# Patient Record
Sex: Female | Born: 1955 | Race: Black or African American | Hispanic: No | Marital: Single | Smoking: Never smoker
Health system: Southern US, Community
[De-identification: ages and names within clinical notes are randomized; demographics above are authoritative.]

## PROBLEM LIST (undated history)

## (undated) DIAGNOSIS — K219 Gastro-esophageal reflux disease without esophagitis: Secondary | ICD-10-CM

## (undated) DIAGNOSIS — E785 Hyperlipidemia, unspecified: Secondary | ICD-10-CM

## (undated) DIAGNOSIS — Z9889 Other specified postprocedural states: Secondary | ICD-10-CM

## (undated) DIAGNOSIS — E119 Type 2 diabetes mellitus without complications: Secondary | ICD-10-CM

## (undated) DIAGNOSIS — F329 Major depressive disorder, single episode, unspecified: Secondary | ICD-10-CM

## (undated) DIAGNOSIS — I10 Essential (primary) hypertension: Secondary | ICD-10-CM

## (undated) DIAGNOSIS — R112 Nausea with vomiting, unspecified: Secondary | ICD-10-CM

## (undated) DIAGNOSIS — F32A Depression, unspecified: Secondary | ICD-10-CM

## (undated) DIAGNOSIS — M1990 Unspecified osteoarthritis, unspecified site: Secondary | ICD-10-CM

## (undated) HISTORY — PX: TUBAL LIGATION: SHX77

## (undated) HISTORY — DX: Gastro-esophageal reflux disease without esophagitis: K21.9

## (undated) HISTORY — PX: CARPAL TUNNEL RELEASE: SHX101

## (undated) HISTORY — PX: DILATION AND CURETTAGE OF UTERUS: SHX78

## (undated) HISTORY — DX: Hyperlipidemia, unspecified: E78.5

---

## 1998-08-30 ENCOUNTER — Ambulatory Visit (HOSPITAL_COMMUNITY): Admission: RE | Admit: 1998-08-30 | Discharge: 1998-08-30 | Payer: Self-pay | Admitting: Obstetrics and Gynecology

## 1998-08-30 ENCOUNTER — Encounter: Payer: Self-pay | Admitting: Obstetrics and Gynecology

## 2001-06-07 ENCOUNTER — Encounter: Admission: RE | Admit: 2001-06-07 | Discharge: 2001-06-07 | Payer: Self-pay | Admitting: Occupational Medicine

## 2001-06-07 ENCOUNTER — Encounter: Payer: Self-pay | Admitting: Occupational Medicine

## 2001-06-10 ENCOUNTER — Other Ambulatory Visit: Admission: RE | Admit: 2001-06-10 | Discharge: 2001-06-10 | Payer: Self-pay | Admitting: Obstetrics and Gynecology

## 2006-01-05 ENCOUNTER — Other Ambulatory Visit: Admission: RE | Admit: 2006-01-05 | Discharge: 2006-01-05 | Payer: Self-pay | Admitting: Internal Medicine

## 2006-01-17 ENCOUNTER — Emergency Department (HOSPITAL_COMMUNITY): Admission: EM | Admit: 2006-01-17 | Discharge: 2006-01-17 | Payer: Self-pay | Admitting: Family Medicine

## 2006-06-12 ENCOUNTER — Emergency Department (HOSPITAL_COMMUNITY): Admission: EM | Admit: 2006-06-12 | Discharge: 2006-06-12 | Payer: Self-pay | Admitting: Family Medicine

## 2006-08-08 ENCOUNTER — Emergency Department (HOSPITAL_COMMUNITY): Admission: EM | Admit: 2006-08-08 | Discharge: 2006-08-08 | Payer: Self-pay | Admitting: Emergency Medicine

## 2008-01-22 ENCOUNTER — Telehealth (INDEPENDENT_AMBULATORY_CARE_PROVIDER_SITE_OTHER): Payer: Self-pay | Admitting: *Deleted

## 2008-01-22 ENCOUNTER — Encounter: Payer: Self-pay | Admitting: Gastroenterology

## 2008-06-23 DIAGNOSIS — K219 Gastro-esophageal reflux disease without esophagitis: Secondary | ICD-10-CM

## 2008-06-24 ENCOUNTER — Ambulatory Visit: Payer: Self-pay | Admitting: Internal Medicine

## 2008-06-24 DIAGNOSIS — R1310 Dysphagia, unspecified: Secondary | ICD-10-CM | POA: Insufficient documentation

## 2008-06-24 DIAGNOSIS — R1319 Other dysphagia: Secondary | ICD-10-CM

## 2008-06-25 ENCOUNTER — Ambulatory Visit: Payer: Self-pay | Admitting: Internal Medicine

## 2008-06-25 DIAGNOSIS — R109 Unspecified abdominal pain: Secondary | ICD-10-CM | POA: Insufficient documentation

## 2008-06-30 ENCOUNTER — Encounter: Payer: Self-pay | Admitting: Gastroenterology

## 2008-06-30 DIAGNOSIS — D375 Neoplasm of uncertain behavior of rectum: Secondary | ICD-10-CM

## 2008-06-30 DIAGNOSIS — D378 Neoplasm of uncertain behavior of other specified digestive organs: Secondary | ICD-10-CM

## 2008-06-30 DIAGNOSIS — D371 Neoplasm of uncertain behavior of stomach: Secondary | ICD-10-CM | POA: Insufficient documentation

## 2008-07-09 ENCOUNTER — Ambulatory Visit: Payer: Self-pay | Admitting: Gastroenterology

## 2008-07-09 ENCOUNTER — Ambulatory Visit (HOSPITAL_COMMUNITY): Admission: RE | Admit: 2008-07-09 | Discharge: 2008-07-09 | Payer: Self-pay | Admitting: Gastroenterology

## 2008-07-09 ENCOUNTER — Encounter: Payer: Self-pay | Admitting: Gastroenterology

## 2008-07-14 ENCOUNTER — Telehealth (INDEPENDENT_AMBULATORY_CARE_PROVIDER_SITE_OTHER): Payer: Self-pay | Admitting: *Deleted

## 2008-07-16 ENCOUNTER — Encounter (INDEPENDENT_AMBULATORY_CARE_PROVIDER_SITE_OTHER): Payer: Self-pay | Admitting: *Deleted

## 2008-09-08 ENCOUNTER — Ambulatory Visit: Payer: Self-pay | Admitting: Interventional Radiology

## 2008-09-08 ENCOUNTER — Ambulatory Visit (HOSPITAL_BASED_OUTPATIENT_CLINIC_OR_DEPARTMENT_OTHER): Admission: RE | Admit: 2008-09-08 | Discharge: 2008-09-08 | Payer: Self-pay | Admitting: Family Medicine

## 2008-10-02 ENCOUNTER — Ambulatory Visit (HOSPITAL_COMMUNITY): Admission: RE | Admit: 2008-10-02 | Discharge: 2008-10-02 | Payer: Self-pay | Admitting: Sports Medicine

## 2009-07-07 ENCOUNTER — Encounter (INDEPENDENT_AMBULATORY_CARE_PROVIDER_SITE_OTHER): Payer: Self-pay | Admitting: *Deleted

## 2010-02-02 ENCOUNTER — Telehealth: Payer: Self-pay | Admitting: Internal Medicine

## 2010-07-04 ENCOUNTER — Encounter: Payer: Self-pay | Admitting: Family Medicine

## 2010-07-12 NOTE — Progress Notes (Signed)
Summary: Schedule Endoscopy  Phone Note Outgoing Call Call back at [facility] Phone [phone]   Call placed by: Harlow Mares CMA Duncan Dull),  February 02, 2010 9:42 AM Call placed to: Patient Summary of Call: patients mailbox if full we can not leave a message, i will try to call the patient again. she is due for an EGD. Initial call taken by: Harlow Mares CMA Duncan Dull),  February 02, 2010 9:43 AM  Follow-up for Phone Call        patients mailbox is full, we will mail her a letter to remind her she is due for her EGD Follow-up by: Harlow Mares CMA Duncan Dull),  February 07, 2010 11:24 AM

## 2010-07-12 NOTE — Letter (Signed)
Summary: Endoscopy Letter  [facility]  [address] [place], [place] [address]   Phone: [phone]  Fax: [phone]      July 07, 2009 MRN: [id number]   Virginia Rios [address] [place], [place]  [address]   Dear Ms. Virginia Rios,   According to your medical record, it is time for you to schedule an Endoscopy. Endoscopic screening is recommended for patients with certain upper digestive tract conditions because of associated increased risk for cancers of the upper digestive system.  This letter has been generated based on the recommendations made at the time of your prior procedure. If you feel that in your particular situation this may no longer apply, please contact our office.  Please call our office at [phone]) to schedule this appointment or to update your records at your earliest convenience.  Thank you for cooperating with [place] to provide you with the very best care possible.   Sincerely,  Wilhemina Bonito. Marina Goodell, M.D.  [facility] [phone]

## 2010-10-05 IMAGING — CR DG ANKLE COMPLETE 3+V*R*
3 series · 3 of 3 positions shown · non-contrast
Comparison: None

CLINICAL DATA: Pain post fall

RIGHT ANKLE - COMPLETE 3+ VIEW

[t ankle joint ap right]
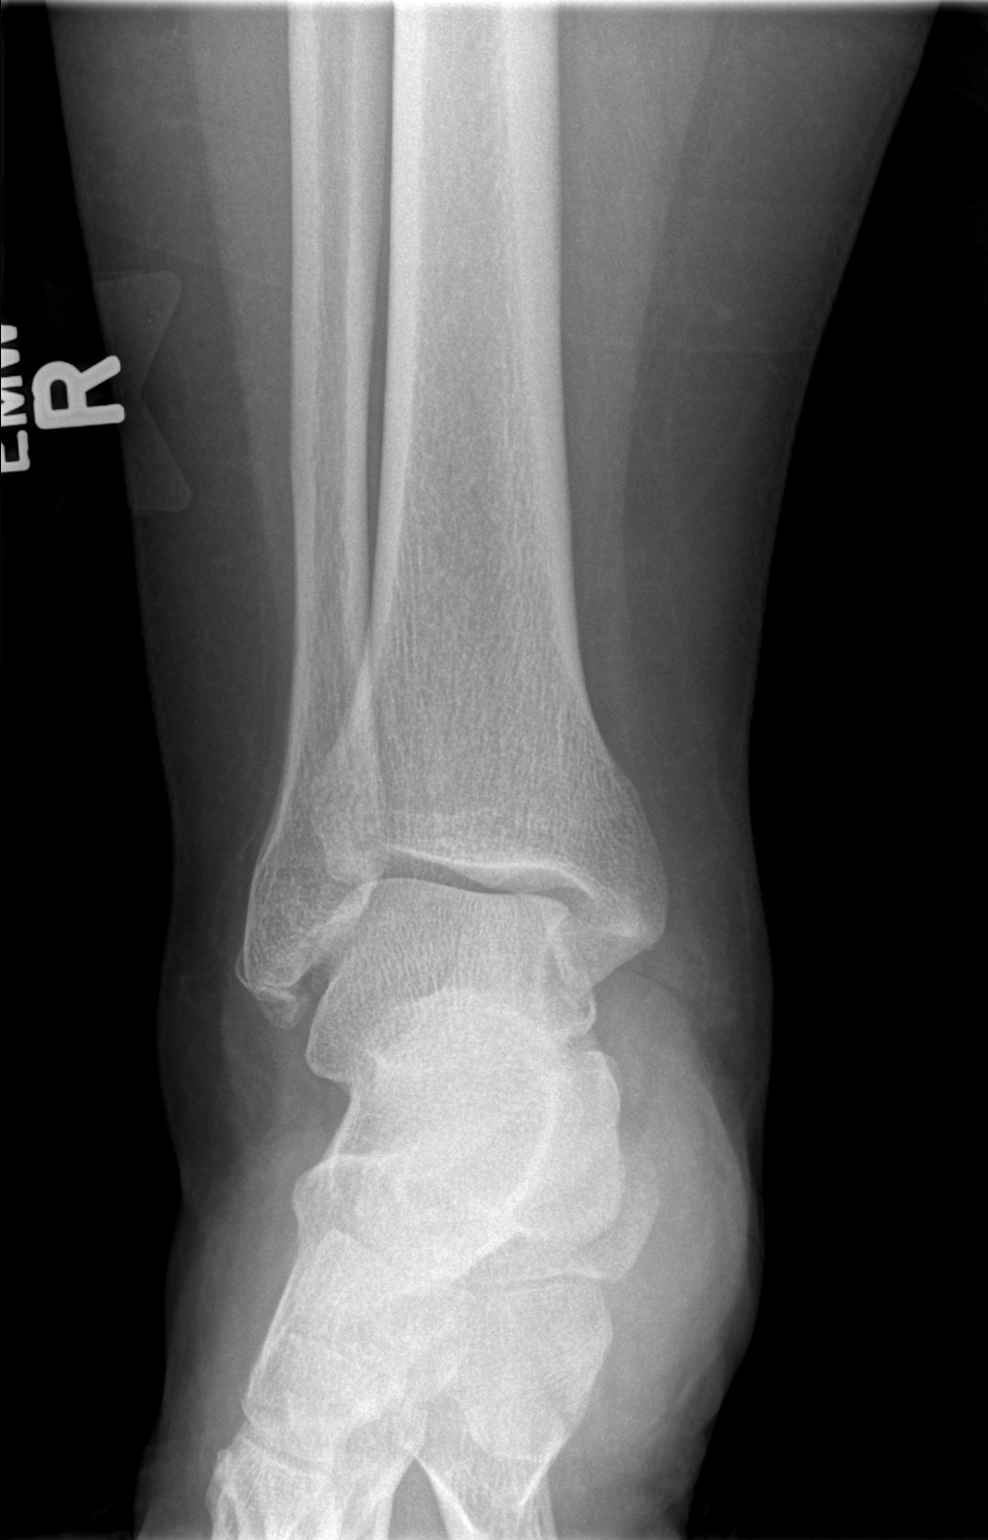

[t ankle joint oblique right]
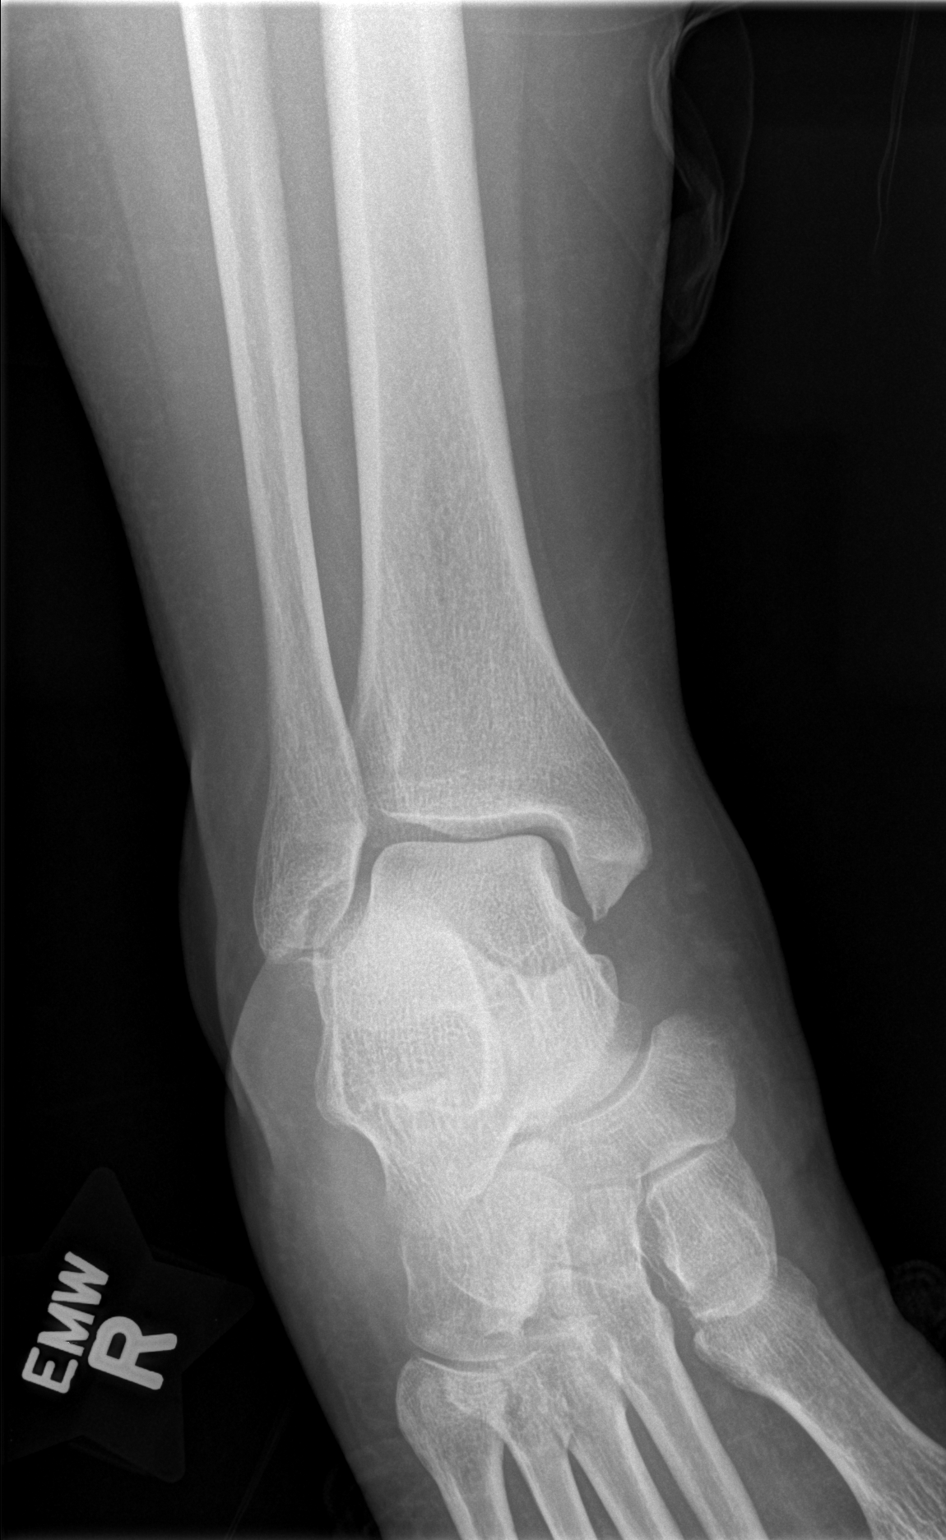

[t ankle joint lat right]
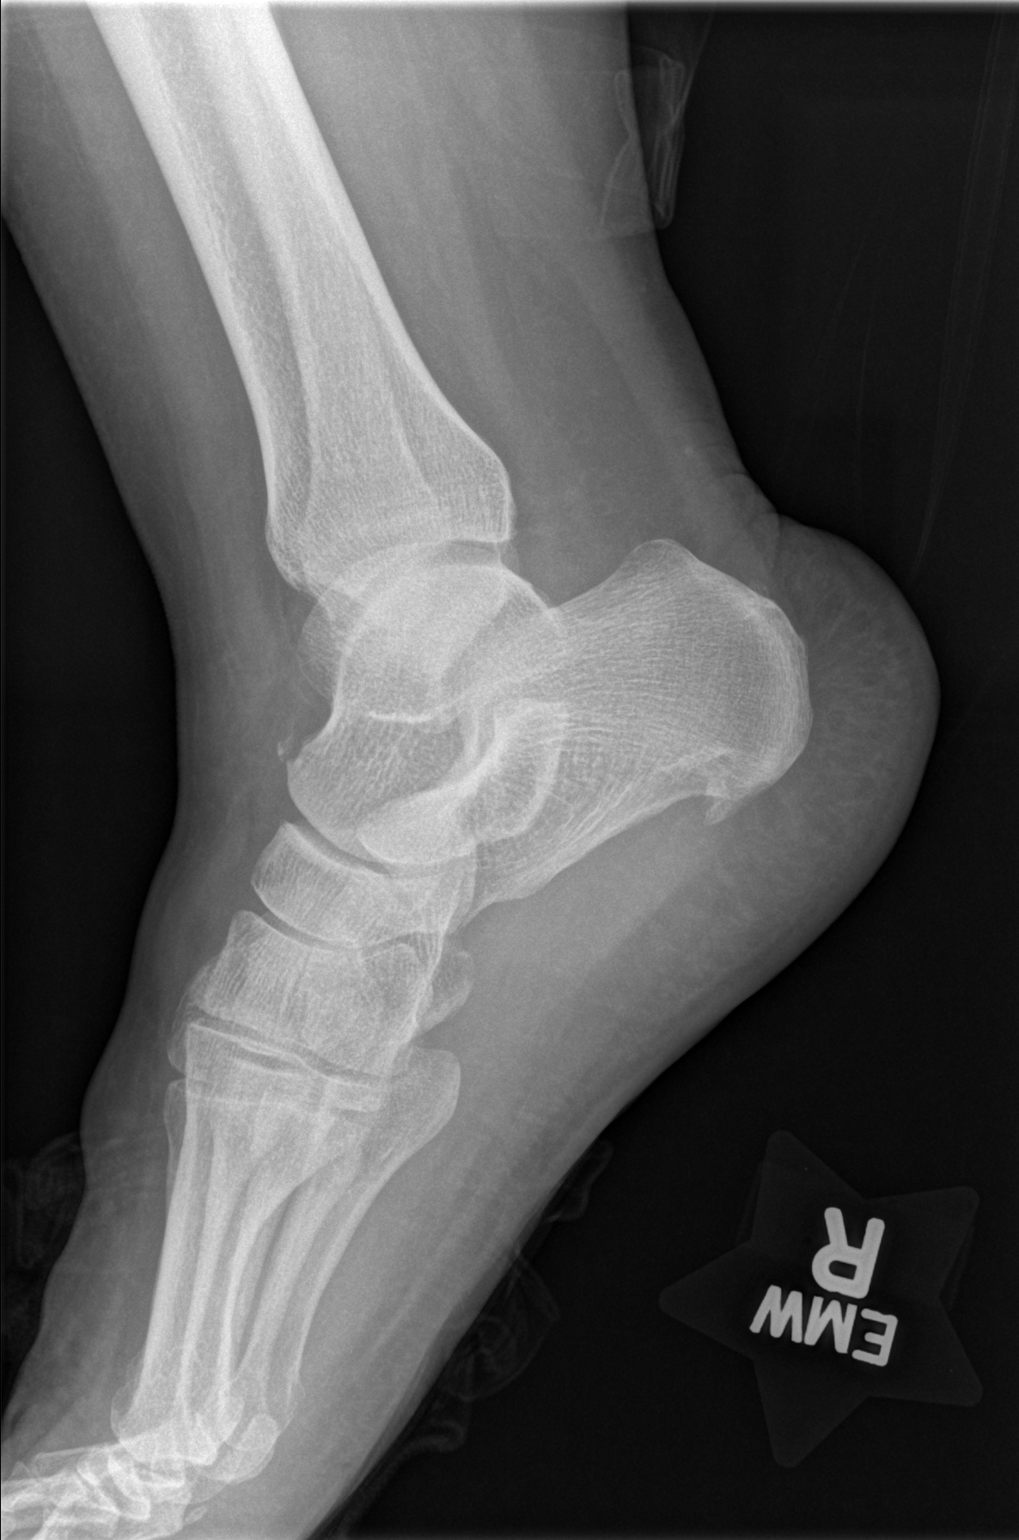

[3 of 3 positions shown; findings below may reference images not displayed]

FINDINGS: Corticated ossicle inferior to the lateral malleolus.
There is a small cortical avulsion fragment or spur from the dorsal
margin of the distal talus.  The ankle mortise is intact.
Calcaneal spur at the plantar aponeurosis. Normal mineralization
and alignment.
IMPRESSION: 1.  Cortical avulsion fragment or spur from the dorsal margin of
the distal talus.  Correlate with point tenderness.

## 2011-09-11 ENCOUNTER — Encounter: Payer: Self-pay | Admitting: Internal Medicine

## 2011-12-29 ENCOUNTER — Ambulatory Visit (INDEPENDENT_AMBULATORY_CARE_PROVIDER_SITE_OTHER): Payer: Self-pay | Admitting: Family Medicine

## 2011-12-29 ENCOUNTER — Encounter: Payer: Self-pay | Admitting: Family Medicine

## 2011-12-29 VITALS — BP 148/78 | HR 88 | Ht 60.0 in | Wt 244.0 lb

## 2011-12-29 DIAGNOSIS — M25569 Pain in unspecified knee: Secondary | ICD-10-CM

## 2011-12-29 DIAGNOSIS — I10 Essential (primary) hypertension: Secondary | ICD-10-CM | POA: Insufficient documentation

## 2011-12-29 MED ORDER — HYDROCHLOROTHIAZIDE 12.5 MG PO CAPS: 12.5000 mg | ORAL_CAPSULE | Freq: Every day | ORAL | Status: DC

## 2011-12-29 NOTE — Progress Notes (Signed)
Patient ID: Virginia Rios, female   DOB: 30-Dec-1955, 56 y.o.   MRN: [id number]   HPI: Virginia Rios is a 56 yo female who presents to establish care and to obtain the [facility] card. She has not been to a doctor in 2.5 years. She previously took lipitor.  BLOOD PRESSURE: Has been elevated in past at [facility]. Does c/o of some lightheadedness, headaches, and visual changes (floaters). No chest pain, but does have palpitations that last less than one minute around once per month. No shortness of breath. Does note swelling in her legs.  KNEE PAIN: Has pains in her knees that feel like bone on bone pain. The pain is worse with walking. Her knees are not ever red, but they do get warm. Has tried Aleve without significant relief.  WEIGHT: Walks 1-2 hours per day. Has been trying to cut down on fried food; eating more veggies; decreasing sugary drinks.  PMH reviewed and entered into chart. Pt is nonsmoker. 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 LMP years ago. Symptomatic of menopause with hot flashes/mood swings.  Note: at very end of visit as pt was about to leave, she mentioned spotting that occurs occasionally when she urinates. Informed her that she can make another appt to talk about this.  EXAM: Gen: NAD, pleasant, cooperative, morbidly obese Resp: normal work of breathing, CTAB Heart: RRR, no m/r/g Abd: nondistended, nontender, no masses Ext: no grinding with knee flexion and extension, no erythema or warmth of knees; no pitting edema bilaterally although legs are very obese.

## 2011-12-29 NOTE — Patient Instructions (Addendum)
It was nice to meet you today!  For your blood pressure, I have sent a prescription for HCTZ 12.5 mg to [facility] for you. This should cost $4.00.  Keep working on [employer], and continue walking. Watching how much salt you consume will help your blood pressure. Keep a food diary by writing down everything you eat for 3 days. Please also check your blood pressure a few times per week and write down the numbers so we can go over them at your next appointment.  For your knee pain, you can take Aleve or Advil. Follow the instructions on the bottle.  I will call you if any of your labs are not good, otherwise I will send you a letter. Schedule a physical for one month from now. We will recheck your blood pressure then and see how the weight loss is going.   DASH Diet The DASH diet stands for "Dietary Approaches to Stop Hypertension." It is a healthy eating plan that has been shown to reduce high blood pressure (hypertension) in as little as 14 days, while also possibly providing other significant health benefits. These other health benefits include reducing the risk of breast cancer after menopause and reducing the risk of type 2 diabetes, heart disease, colon cancer, and stroke. Health benefits also include weight loss and slowing kidney failure in patients with chronic kidney disease.  DIET GUIDELINES  Limit salt (sodium). Your diet should contain less than 1500 mg of sodium daily.   Limit refined or processed carbohydrates. Your diet should include mostly whole grains. Desserts and added sugars should be used sparingly.   Include small amounts of heart-healthy fats. These types of fats include nuts, oils, and tub margarine. Limit saturated and trans fats. These fats have been shown to be harmful in the body.  CHOOSING FOODS  The following food groups are based on a 2000 calorie diet. See your Registered Dietitian for individual calorie needs. Grains and Grain Products (6 to 8 servings daily)  Eat  More Often: Whole-wheat bread, brown rice, whole-grain or wheat pasta, quinoa, popcorn without added fat or salt (air popped).   Eat Less Often: White bread, white pasta, white rice, cornbread.  Vegetables (4 to 5 servings daily)  Eat More Often: Fresh, frozen, and canned vegetables. Vegetables may be raw, steamed, roasted, or grilled with a minimal amount of fat.   Eat Less Often/Avoid: Creamed or fried vegetables. Vegetables in a cheese sauce.  Fruit (4 to 5 servings daily)  Eat More Often: All fresh, canned (in natural juice), or frozen fruits. Dried fruits without added sugar. One hundred percent fruit juice ( cup [237 mL] daily).   Eat Less Often: Dried fruits with added sugar. Canned fruit in light or heavy syrup.  [employer], Fish, and Poultry (2 servings or less daily. One serving is 3 to 4 oz [85-114 g]).  Eat More Often: Ninety percent or leaner ground beef, tenderloin, sirloin. Round cuts of beef, chicken breast, [place] breast. All fish. Grill, bake, or broil your meat. Nothing should be fried.   Eat Less Often/Avoid: Fatty cuts of meat, [place], or chicken leg, thigh, or wing. Fried cuts of meat or fish.  Dairy (2 to 3 servings)  Eat More Often: Low-fat or fat-free milk, low-fat plain or light yogurt, reduced-fat or part-skim cheese.   Eat Less Often/Avoid: Milk (whole, 2%, skim, or chocolate).Whole milk yogurt. Full-fat cheeses.  Nuts, Seeds, and Legumes (4 to 5 servings per week)  Eat More Often: All without  added salt.   Eat Less Often/Avoid: Salted nuts and seeds, canned beans with added salt.  Fats and Sweets (limited)  Eat More Often: Vegetable oils, tub margarines without trans fats, sugar-free gelatin. Mayonnaise and salad dressings.   Eat Less Often/Avoid: Coconut oils, palm oils, butter, stick margarine, cream, half and half, cookies, candy, pie.  FOR MORE INFORMATION The Dash Diet Eating Plan: www.dashdiet.org Document Released: 05/18/2011 Document Reviewed:  05/08/2011 [facility] Patient Information 2012 [place], [place].

## 2011-12-29 NOTE — Assessment & Plan Note (Addendum)
Pt currently walking 1-2 hours per day, and has been working on diet. Encouraged her to continue walking and to focus on her diet. Handout given on DASH diet as she is also hypertensive. She will complete a 3 day food diary and bring it with her to her next visit. Precepted with [place].

## 2011-12-29 NOTE — Assessment & Plan Note (Addendum)
BP elevated at 148/78 today; pt reports history of elevated readings at [facility]. She is motivated to lower her blood pressure and would like to start a medication. Will start with 12.5 mg of HCTZ daily. I have given her instructions on the DASH diet and discussed salt restriction. Will check bmet today, also check lipids as she thinks she took lipitor at one time. Precepted with Chambliss.

## 2011-12-29 NOTE — Assessment & Plan Note (Addendum)
Most likely osteoarthritis due to "bone on bone" feeling pt reports. We discussed weight loss as one of the best treatments; I would expect her knee pain to improve with weight loss. I also told her she can use Aleve or Advil prn. Precepted with Chambliss.

## 2011-12-30 LAB — CBC WITH DIFFERENTIAL/PLATELET
Basophils Absolute: 0 10*3/uL (ref 0.0–0.1)
Basophils Relative: 0 % (ref 0–1)
Eosinophils Absolute: 0.2 10*3/uL (ref 0.0–0.7)
MCH: 23.5 pg — ABNORMAL LOW (ref 26.0–34.0)
MCHC: 29.7 g/dL — ABNORMAL LOW (ref 30.0–36.0)
Neutrophils Relative %: 61 % (ref 43–77)
Platelets: 241 10*3/uL (ref 150–400)
RBC: 5.27 MIL/uL — ABNORMAL HIGH (ref 3.87–5.11)
RDW: 16.2 % — ABNORMAL HIGH (ref 11.5–15.5)

## 2011-12-30 LAB — LIPID PANEL
Cholesterol: 251 mg/dL — ABNORMAL HIGH (ref 0–200)
Total CHOL/HDL Ratio: 5.3 Ratio
Triglycerides: 209 mg/dL — ABNORMAL HIGH (ref <150)
VLDL: 42 mg/dL — ABNORMAL HIGH (ref 0–40)

## 2011-12-30 LAB — BASIC METABOLIC PANEL
Glucose, Bld: 93 mg/dL (ref 70–99)
Potassium: 4 mEq/L (ref 3.5–5.3)
Sodium: 141 mEq/L (ref 135–145)

## 2012-01-03 ENCOUNTER — Encounter: Payer: Self-pay | Admitting: Family Medicine

## 2012-04-15 ENCOUNTER — Encounter (HOSPITAL_COMMUNITY): Payer: Self-pay | Admitting: Emergency Medicine

## 2012-04-15 ENCOUNTER — Emergency Department (INDEPENDENT_AMBULATORY_CARE_PROVIDER_SITE_OTHER)

## 2012-04-15 ENCOUNTER — Emergency Department (INDEPENDENT_AMBULATORY_CARE_PROVIDER_SITE_OTHER)
Admission: EM | Admit: 2012-04-15 | Discharge: 2012-04-15 | Disposition: A | Discharge status: Home / Self Care | Payer: Self-pay | Source: Home / Self Care | Attending: Family Medicine | Admitting: Family Medicine

## 2012-04-15 DIAGNOSIS — J069 Acute upper respiratory infection, unspecified: Secondary | ICD-10-CM

## 2012-04-15 DIAGNOSIS — R05 Cough: Secondary | ICD-10-CM

## 2012-04-15 DIAGNOSIS — R059 Cough, unspecified: Secondary | ICD-10-CM

## 2012-04-15 LAB — POCT RAPID STREP A: Streptococcus, Group A Screen (Direct): NEGATIVE

## 2012-04-15 MED ORDER — GUAIFENESIN-CODEINE 100-10 MG/5ML PO SYRP: 5.0000 mL | ORAL_SOLUTION | Freq: Three times a day (TID) | ORAL | Status: DC | PRN

## 2012-04-15 MED ORDER — AZITHROMYCIN 250 MG PO TABS: ORAL_TABLET | ORAL | Status: DC

## 2012-04-15 MED ORDER — ALBUTEROL SULFATE HFA 108 (90 BASE) MCG/ACT IN AERS: 2.0000 | INHALATION_SPRAY | RESPIRATORY_TRACT | Status: AC | PRN

## 2012-04-15 MED ORDER — ALBUTEROL SULFATE (5 MG/ML) 0.5% IN NEBU
INHALATION_SOLUTION | RESPIRATORY_TRACT | Status: AC
  Filled 2012-04-15: qty 1

## 2012-04-15 MED ORDER — ALBUTEROL SULFATE (5 MG/ML) 0.5% IN NEBU
5.0000 mg | INHALATION_SOLUTION | Freq: Once | RESPIRATORY_TRACT | Status: AC
  Administered 2012-04-15: 5 mg via RESPIRATORY_TRACT

## 2012-04-15 NOTE — ED Provider Notes (Signed)
History     CSN: [id number]  Arrival date & time 04/15/12  4098   First MD Initiated Contact with Patient 04/15/12 1044      Chief Complaint  Patient presents with  . URI    (Consider location/radiation/quality/duration/timing/severity/associated sxs/prior treatment) Patient is a 56 y.o. female presenting with URI. The history is provided by the patient.  URI The primary symptoms include sore throat, cough and myalgias. The current episode started 2 days ago. This is a new problem. The problem has been gradually worsening.  The sore throat is mild (scratchy) in intensity. The sore throat is accompanied by hoarse voice. The sore throat is not accompanied by trouble swallowing.  The cough is productive. The sputum is yellow.  The onset of the illness is associated with exposure to sick contacts. Symptoms associated with the illness include chills, facial pain, sinus pressure, congestion and rhinorrhea. The illness is not associated with plugged ear sensation. The following treatments were addressed: Acetaminophen was ineffective. A decongestant was ineffective.    Past Medical History  Diagnosis Date  . GERD (gastroesophageal reflux disease)   . Hyperlipidemia     used to take lipitor    Past Surgical History  Procedure Date  . Tubal ligation     1980s  . Carpal tunnel release     left hand    Family History  Problem Relation Age of Onset  . Alzheimer's disease Mother   . Heart disease Mother   . Diabetes Mother   . Kidney disease Mother   . Hypertension Mother   . Stroke Mother   . Alcohol abuse Father   . Heart disease Father   . Hyperlipidemia Sister   . Hypertension Sister   . Hyperlipidemia Brother   . Hypertension Brother   . Cancer Paternal Aunt     ovarian cancer in her 77s    History  Substance Use Topics  . Smoking status: Never Smoker   . Smokeless tobacco: Not on file  . Alcohol Use: No    OB History    Grav Para Term Preterm Abortions TAB SAB  Ect Mult Living                  Review of Systems  Constitutional: Positive for chills.  HENT: Positive for congestion, sore throat, hoarse voice, rhinorrhea and sinus pressure. Negative for trouble swallowing.   Respiratory: Positive for cough and shortness of breath.   Cardiovascular: Positive for chest pain.  Musculoskeletal: Positive for myalgias.  All other systems reviewed and are negative.    Allergies  Review of patient's allergies indicates no known allergies.  Home Medications   Current Outpatient Rx  Name  Route  Sig  Dispense  Refill  . ALBUTEROL SULFATE HFA 108 (90 BASE) MCG/ACT IN AERS   Inhalation   Inhale 2 puffs into the lungs every 4 (four) hours as needed for wheezing.   1 Inhaler   2   . AZITHROMYCIN 250 MG PO TABS      Azithromycin 500mg  on day 1, then 250mg  on days 2-4   6 tablet   0   . GUAIFENESIN-CODEINE 100-10 MG/5ML PO SYRP   Oral   Take 5 mLs by mouth 3 (three) times daily as needed for cough.   120 mL   0   . HYDROCHLOROTHIAZIDE 12.5 MG PO CAPS   Oral   Take 1 capsule (12.5 mg total) by mouth daily.   30 capsule   1  BP 143/87  Pulse 93  Temp 99.1 F (37.3 C) (Oral)  Resp 20  SpO2 96%  Physical Exam  Nursing note and vitals reviewed. Constitutional: She is oriented to person, place, and time. Vital signs are normal. She appears well-developed and well-nourished. She is active and cooperative.  HENT:  Head: Normocephalic.  Right Ear: Hearing, tympanic membrane, external ear and ear canal normal.  Left Ear: Hearing, tympanic membrane, external ear and ear canal normal.  Nose: Right sinus exhibits maxillary sinus tenderness. Left sinus exhibits maxillary sinus tenderness.  Mouth/Throat: Uvula is midline and mucous membranes are normal. Posterior oropharyngeal erythema present. No posterior oropharyngeal edema.  Eyes: Conjunctivae normal and EOM are normal. Pupils are equal, round, and reactive to light. No scleral icterus.    Neck: Trachea normal, normal range of motion and full passive range of motion without pain. Neck supple. No JVD present. No spinous process tenderness and no muscular tenderness present.  Cardiovascular: Normal rate, regular rhythm, normal heart sounds, intact distal pulses and normal pulses.   Pulmonary/Chest: Effort normal and breath sounds normal.  Lymphadenopathy:       Head (right side): No submental, no submandibular, no tonsillar, no preauricular, no posterior auricular and no occipital adenopathy present.       Head (left side): No submental, no submandibular, no tonsillar, no preauricular, no posterior auricular and no occipital adenopathy present.    She has cervical adenopathy.       Right cervical: No superficial cervical adenopathy present.      Left cervical: Superficial cervical adenopathy present.  Neurological: She is alert and oriented to person, place, and time. She has normal strength. No cranial nerve deficit or sensory deficit. Coordination and gait normal. GCS eye subscore is 4. GCS verbal subscore is 5. GCS motor subscore is 6.  Skin: Skin is warm and dry.  Psychiatric: She has a normal mood and affect. Her speech is normal and behavior is normal. Judgment and thought content normal. Cognition and memory are normal.    ED Course  Procedures (including critical care time)   Labs Reviewed  POCT RAPID STREP A ([facility]RG CARE ONLY)   Dg Chest 2 View  04/15/2012  *RADIOLOGY REPORT*  Clinical Data: Cough, shortness of breath  CHEST - 2 VIEW  Comparison: None.  Findings: Enlarged cardiac contour.  Mediastinal contours otherwise within normal range.  Hypoaeration with interstitial and vascular crowding.  Mild central peribronchial thickening.  No confluent airspace opacity. No pleural effusion or pneumothorax.  Mild multilevel degenerative changes.  No acute osseous finding.  IMPRESSION: Prominent cardiac contour.  Mild central peribronchial thickening can be seen with infection  or edema.  No confluent airspace opacity.   Original Report Authenticated By: Jearld Lesch, M.D.      1. URI (upper respiratory infection)   2. Cough       MDM  Increase fluid intake, rest.  Begin Azithromycin.  Begin expectorant/decongestant, topical decongestant, saline nasal spray and/or saline irrigation, and cough suppressant at bedtime. Antihistamines of your choice (Claritin or Zyrtec).  Albuterol for cough and shortness of breath.  Tylenol or Motrin for fever/discomfort.  Followup with PCP if not improving 7 to 10 days.         Johnsie Kindred, NP 04/15/12 1203  Johnsie Kindred, NP 04/15/12 1231

## 2012-04-15 NOTE — ED Notes (Signed)
Pt c/o sx x2 days... Sx incude: body aches, fevers, cough w/yellow sputum, bilateral ear pain, congestion, chest discomfort when she coughs, nausea, sore throat... Denies: vomiting and diarrhea... Pt is alert w/no signs of distress.

## 2012-04-16 NOTE — ED Provider Notes (Signed)
Medical screening examination/treatment/procedure(s) were performed by non-physician practitioner and as supervising physician I was immediately available for consultation/collaboration.   [facility]; MD   Sharin Grave, MD 04/16/12 [phone]

## 2013-06-17 ENCOUNTER — Encounter: Payer: Self-pay | Admitting: Internal Medicine

## 2013-11-18 ENCOUNTER — Encounter: Payer: Self-pay | Admitting: Internal Medicine

## 2015-01-05 ENCOUNTER — Other Ambulatory Visit: Payer: Self-pay | Admitting: Orthopedic Surgery

## 2015-01-05 DIAGNOSIS — M25571 Pain in right ankle and joints of right foot: Secondary | ICD-10-CM

## 2015-01-09 ENCOUNTER — Other Ambulatory Visit

## 2015-01-22 ENCOUNTER — Ambulatory Visit
Admission: RE | Admit: 2015-01-22 | Discharge: 2015-01-22 | Disposition: A | Discharge status: Home / Self Care | Source: Ambulatory Visit | Attending: Orthopedic Surgery | Admitting: Orthopedic Surgery

## 2015-01-22 DIAGNOSIS — M25571 Pain in right ankle and joints of right foot: Secondary | ICD-10-CM

## 2015-03-01 ENCOUNTER — Other Ambulatory Visit (HOSPITAL_COMMUNITY): Payer: Self-pay | Admitting: Orthopedic Surgery

## 2015-03-22 NOTE — Pre-Procedure Instructions (Signed)
Virginia Rios  03/22/2015      [id number] [place] [place]. [address] [address] Phone: [phone] Fax: [phone]  [facility] [place], [place] [place] [address] Phone: [phone] Fax: [phone]    Your procedure is scheduled on Wed, Oct 19 @ 10:30 AM  Report to [facility] Admitting at 8:30 AM  Call this number if you have problems the morning of surgery:  [phone]   Remember:  Do not eat food or drink liquids after midnight.  Take these medicines the morning of surgery with A SIP OF WATER Albuterol<Bring Your Inhaler With You>,Pantoprazole(Protonix),and Sertraline(Zoloft)              Stop taking your Meloxicam 7 days prior to surgery. No Goody's,BC's,Aleve,Aspirin,Ibuprofen,Fish Oil,or any Herbal Medications.    Do not wear jewelry, make-up or nail polish.  Do not wear lotions, powders, or perfumes.  You may wear deodorant.  Do not shave 48 hours prior to surgery.    Do not bring valuables to the hospital.  [facility] is not responsible for any belongings or valuables.  Contacts, dentures or bridgework may not be worn into surgery.  Leave your suitcase in the car.  After surgery it may be brought to your room.  For patients admitted to the hospital, discharge time will be determined by your treatment team.  Patients discharged the day of surgery will not be allowed to drive home.    Special instructions:  [facility] - Preparing for Surgery  Before surgery, you can play an important role.  Because skin is not sterile, your skin needs to be as free of germs as possible.  You can reduce the number of germs on you skin by washing with CHG (chlorahexidine gluconate) soap before surgery.  CHG is an antiseptic cleaner which kills germs and bonds with the skin to continue killing germs even after washing.  Please DO NOT use if you have an  allergy to CHG or antibacterial soaps.  If your skin becomes reddened/irritated stop using the CHG and inform your nurse when you arrive at Short Stay.  Do not shave (including legs and underarms) for at least 48 hours prior to the first CHG shower.  You may shave your face.  Please follow these instructions carefully:   1.  Shower with CHG Soap the night before surgery and the                                morning of Surgery.  2.  If you choose to wash your hair, wash your hair first as usual with your       normal shampoo.  3.  After you shampoo, rinse your hair and body thoroughly to remove the                      Shampoo.  4.  Use CHG as you would any other liquid soap.  You can apply chg directly       to the skin and wash gently with scrungie or a clean washcloth.  5.  Apply the CHG Soap to your body ONLY FROM THE NECK DOWN.        Do not use on open wounds or open sores.  Avoid contact with your eyes,  ears, mouth and genitals (private parts).  Wash genitals (private parts)       with your normal soap.  6.  Wash thoroughly, paying special attention to the area where your surgery        will be performed.  7.  Thoroughly rinse your body with warm water from the neck down.  8.  DO NOT shower/wash with your normal soap after using and rinsing off       the CHG Soap.  9.  Pat yourself dry with a clean towel.            10.  Wear clean pajamas.            11.  Place clean sheets on your bed the night of your first shower and do not        sleep with pets.  Day of Surgery  Do not apply any lotions/deoderants the morning of surgery.  Please wear clean clothes to the hospital/surgery center.    Please read over the following fact sheets that you were given. Pain Booklet, Coughing and Deep Breathing, MRSA Information and Surgical Site Infection Prevention

## 2015-03-23 ENCOUNTER — Encounter (HOSPITAL_COMMUNITY)
Admission: RE | Admit: 2015-03-23 | Discharge: 2015-03-23 | Disposition: A | Discharge status: Home / Self Care | Source: Ambulatory Visit | Attending: Orthopedic Surgery | Admitting: Orthopedic Surgery

## 2015-03-23 ENCOUNTER — Encounter (HOSPITAL_COMMUNITY): Payer: Self-pay

## 2015-03-23 ENCOUNTER — Ambulatory Visit (HOSPITAL_COMMUNITY)
Admission: RE | Admit: 2015-03-23 | Discharge: 2015-03-23 | Disposition: A | Discharge status: Home / Self Care | Source: Ambulatory Visit | Attending: Orthopedic Surgery | Admitting: Orthopedic Surgery

## 2015-03-23 ENCOUNTER — Other Ambulatory Visit (HOSPITAL_COMMUNITY)

## 2015-03-23 DIAGNOSIS — I10 Essential (primary) hypertension: Secondary | ICD-10-CM | POA: Diagnosis not present

## 2015-03-23 DIAGNOSIS — M67961 Unspecified disorder of synovium and tendon, right lower leg: Secondary | ICD-10-CM | POA: Diagnosis not present

## 2015-03-23 DIAGNOSIS — R911 Solitary pulmonary nodule: Secondary | ICD-10-CM | POA: Diagnosis not present

## 2015-03-23 DIAGNOSIS — M76829 Posterior tibial tendinitis, unspecified leg: Secondary | ICD-10-CM

## 2015-03-23 DIAGNOSIS — Z01818 Encounter for other preprocedural examination: Secondary | ICD-10-CM | POA: Diagnosis present

## 2015-03-23 DIAGNOSIS — Z01812 Encounter for preprocedural laboratory examination: Secondary | ICD-10-CM | POA: Diagnosis not present

## 2015-03-23 HISTORY — DX: Major depressive disorder, single episode, unspecified: F32.9

## 2015-03-23 HISTORY — DX: Unspecified osteoarthritis, unspecified site: M19.90

## 2015-03-23 HISTORY — DX: Depression, unspecified: F32.A

## 2015-03-23 HISTORY — DX: Essential (primary) hypertension: I10

## 2015-03-23 LAB — PROTIME-INR
INR: 1.03 (ref 0.00–1.49)
Prothrombin Time: 13.7 seconds (ref 11.6–15.2)

## 2015-03-23 LAB — COMPREHENSIVE METABOLIC PANEL
ALBUMIN: 3.8 g/dL (ref 3.5–5.0)
ALT: 16 U/L (ref 14–54)
AST: 18 U/L (ref 15–41)
Alkaline Phosphatase: 49 U/L (ref 38–126)
Anion gap: 8 (ref 5–15)
BILIRUBIN TOTAL: 0.7 mg/dL (ref 0.3–1.2)
BUN: 9 mg/dL (ref 6–20)
CHLORIDE: 103 mmol/L (ref 101–111)
CO2: 30 mmol/L (ref 22–32)
Calcium: 9.3 mg/dL (ref 8.9–10.3)
Creatinine, Ser: 0.85 mg/dL (ref 0.44–1.00)
GFR calc Af Amer: >60 mL/min (ref >60)
GFR calc non Af Amer: >60 mL/min (ref >60)
GLUCOSE: 94 mg/dL (ref 65–99)
POTASSIUM: 3.8 mmol/L (ref 3.5–5.1)
Sodium: 141 mmol/L (ref 135–145)
TOTAL PROTEIN: 6.9 g/dL (ref 6.5–8.1)

## 2015-03-23 LAB — APTT: aPTT: 33 seconds (ref 24–37)

## 2015-03-23 LAB — SURGICAL PCR SCREEN
MRSA, PCR: NEGATIVE
STAPHYLOCOCCUS AUREUS: NEGATIVE

## 2015-03-23 NOTE — Progress Notes (Signed)
REQUESTED LAST EKG  AND CBC DRAWN 03/23/15 FROM DR. Ruben Gottron [phone].

## 2015-03-29 NOTE — Progress Notes (Signed)
Re- requested EKG. Labs are in care everywhere.

## 2015-03-31 ENCOUNTER — Encounter (HOSPITAL_COMMUNITY)
Admission: RE | Disposition: A | Discharge status: Home / Self Care | Payer: Self-pay | Source: Ambulatory Visit | Attending: Orthopedic Surgery

## 2015-03-31 ENCOUNTER — Ambulatory Visit (HOSPITAL_COMMUNITY): Admitting: Anesthesiology

## 2015-03-31 ENCOUNTER — Ambulatory Visit (HOSPITAL_COMMUNITY)
Admission: RE | Admit: 2015-03-31 | Discharge: 2015-04-01 | Disposition: A | Discharge status: Home Health | Source: Ambulatory Visit | Attending: Orthopedic Surgery | Admitting: Orthopedic Surgery

## 2015-03-31 ENCOUNTER — Encounter (HOSPITAL_COMMUNITY): Payer: Self-pay | Admitting: *Deleted

## 2015-03-31 DIAGNOSIS — M76821 Posterior tibial tendinitis, right leg: Secondary | ICD-10-CM | POA: Insufficient documentation

## 2015-03-31 DIAGNOSIS — M76829 Posterior tibial tendinitis, unspecified leg: Secondary | ICD-10-CM | POA: Diagnosis present

## 2015-03-31 DIAGNOSIS — E785 Hyperlipidemia, unspecified: Secondary | ICD-10-CM | POA: Diagnosis not present

## 2015-03-31 DIAGNOSIS — I10 Essential (primary) hypertension: Secondary | ICD-10-CM | POA: Insufficient documentation

## 2015-03-31 DIAGNOSIS — M21071 Valgus deformity, not elsewhere classified, right ankle: Secondary | ICD-10-CM | POA: Diagnosis not present

## 2015-03-31 HISTORY — PX: ANKLE FUSION: SHX5718

## 2015-03-31 SURGERY — ANKLE FUSION
Anesthesia: Regional | Site: Ankle | Laterality: Right

## 2015-03-31 MED ORDER — HYDROMORPHONE HCL 1 MG/ML IJ SOLN
INTRAMUSCULAR | Status: AC
  Filled 2015-03-31: qty 1

## 2015-03-31 MED ORDER — HYDROMORPHONE HCL 1 MG/ML IJ SOLN
0.2500 mg | INTRAMUSCULAR | Status: DC | PRN
  Administered 2015-03-31 (×2): 0.5 mg via INTRAVENOUS

## 2015-03-31 MED ORDER — HYDROMORPHONE HCL 1 MG/ML IJ SOLN: 1.0000 mg | INTRAMUSCULAR | Status: DC | PRN

## 2015-03-31 MED ORDER — LISINOPRIL 20 MG PO TABS
20.0000 mg | ORAL_TABLET | Freq: Every day | ORAL | Status: DC
  Administered 2015-03-31: 20 mg via ORAL
  Filled 2015-03-31 (×2): qty 1

## 2015-03-31 MED ORDER — ARTIFICIAL TEARS OP OINT
TOPICAL_OINTMENT | OPHTHALMIC | Status: DC | PRN
  Administered 2015-03-31: 1 via OPHTHALMIC

## 2015-03-31 MED ORDER — MIDAZOLAM HCL 2 MG/2ML IJ SOLN
INTRAMUSCULAR | Status: AC
  Administered 2015-03-31: 2 mg via INTRAVENOUS
  Filled 2015-03-31: qty 2

## 2015-03-31 MED ORDER — FENTANYL CITRATE (PF) 250 MCG/5ML IJ SOLN
INTRAMUSCULAR | Status: AC
  Filled 2015-03-31: qty 5

## 2015-03-31 MED ORDER — PROPOFOL 10 MG/ML IV BOLUS
INTRAVENOUS | Status: DC | PRN
  Administered 2015-03-31: 50 mg via INTRAVENOUS
  Administered 2015-03-31: 200 mg via INTRAVENOUS

## 2015-03-31 MED ORDER — FENTANYL CITRATE (PF) 100 MCG/2ML IJ SOLN
50.0000 ug | INTRAMUSCULAR | Status: DC | PRN
  Administered 2015-03-31: 100 ug via INTRAVENOUS

## 2015-03-31 MED ORDER — ACETAMINOPHEN 650 MG RE SUPP: 650.0000 mg | Freq: Four times a day (QID) | RECTAL | Status: DC | PRN

## 2015-03-31 MED ORDER — PANTOPRAZOLE SODIUM 40 MG PO TBEC
40.0000 mg | DELAYED_RELEASE_TABLET | Freq: Every day | ORAL | Status: DC
  Administered 2015-04-01: 40 mg via ORAL
  Filled 2015-03-31: qty 1

## 2015-03-31 MED ORDER — SODIUM CHLORIDE 0.9 % IV SOLN
INTRAVENOUS | Status: DC
  Administered 2015-03-31: 15:00:00 via INTRAVENOUS

## 2015-03-31 MED ORDER — PROMETHAZINE HCL 25 MG/ML IJ SOLN: 6.2500 mg | INTRAMUSCULAR | Status: DC | PRN

## 2015-03-31 MED ORDER — ONDANSETRON HCL 4 MG PO TABS: 4.0000 mg | ORAL_TABLET | Freq: Four times a day (QID) | ORAL | Status: DC | PRN

## 2015-03-31 MED ORDER — METHOCARBAMOL 500 MG PO TABS
ORAL_TABLET | ORAL | Status: AC
Start: 2015-03-31 — End: 2015-04-01
  Filled 2015-03-31: qty 1

## 2015-03-31 MED ORDER — PROPOFOL 10 MG/ML IV BOLUS
INTRAVENOUS | Status: AC
  Filled 2015-03-31: qty 20

## 2015-03-31 MED ORDER — METOCLOPRAMIDE HCL 5 MG PO TABS: 5.0000 mg | ORAL_TABLET | Freq: Three times a day (TID) | ORAL | Status: DC | PRN

## 2015-03-31 MED ORDER — MIDAZOLAM HCL 2 MG/2ML IJ SOLN
INTRAMUSCULAR | Status: AC
  Filled 2015-03-31: qty 4

## 2015-03-31 MED ORDER — LISINOPRIL-HYDROCHLOROTHIAZIDE 20-12.5 MG PO TABS: 1.0000 | ORAL_TABLET | Freq: Every day | ORAL | Status: DC

## 2015-03-31 MED ORDER — HYDROCHLOROTHIAZIDE 12.5 MG PO CAPS
12.5000 mg | ORAL_CAPSULE | Freq: Every day | ORAL | Status: DC
  Administered 2015-03-31 – 2015-04-01 (×2): 12.5 mg via ORAL
  Filled 2015-03-31 (×2): qty 1

## 2015-03-31 MED ORDER — MIDAZOLAM HCL 2 MG/2ML IJ SOLN
1.0000 mg | INTRAMUSCULAR | Status: DC | PRN
  Administered 2015-03-31: 2 mg via INTRAVENOUS

## 2015-03-31 MED ORDER — LACTATED RINGERS IV SOLN
INTRAVENOUS | Status: DC
  Administered 2015-03-31 (×3): via INTRAVENOUS

## 2015-03-31 MED ORDER — CEFAZOLIN SODIUM-DEXTROSE 2-3 GM-% IV SOLR
2.0000 g | INTRAVENOUS | Status: AC
  Administered 2015-03-31: 2 g via INTRAVENOUS

## 2015-03-31 MED ORDER — METHOCARBAMOL 500 MG PO TABS
500.0000 mg | ORAL_TABLET | Freq: Four times a day (QID) | ORAL | Status: DC | PRN
  Administered 2015-03-31 – 2015-04-01 (×4): 500 mg via ORAL
  Filled 2015-03-31 (×3): qty 1

## 2015-03-31 MED ORDER — METOCLOPRAMIDE HCL 5 MG/ML IJ SOLN: 5.0000 mg | Freq: Three times a day (TID) | INTRAMUSCULAR | Status: DC | PRN

## 2015-03-31 MED ORDER — METHOCARBAMOL 1000 MG/10ML IJ SOLN: 500.0000 mg | Freq: Four times a day (QID) | INTRAVENOUS | Status: DC | PRN

## 2015-03-31 MED ORDER — FENTANYL CITRATE (PF) 100 MCG/2ML IJ SOLN
INTRAMUSCULAR | Status: AC
  Administered 2015-03-31: 100 ug via INTRAVENOUS
  Filled 2015-03-31: qty 2

## 2015-03-31 MED ORDER — CHLORHEXIDINE GLUCONATE 4 % EX LIQD: 60.0000 mL | Freq: Once | CUTANEOUS | Status: DC

## 2015-03-31 MED ORDER — MIDAZOLAM HCL 5 MG/5ML IJ SOLN
INTRAMUSCULAR | Status: DC | PRN
  Administered 2015-03-31: 2 mg via INTRAVENOUS

## 2015-03-31 MED ORDER — OXYCODONE HCL 5 MG PO TABS
5.0000 mg | ORAL_TABLET | ORAL | Status: DC | PRN
  Administered 2015-03-31 (×2): 10 mg via ORAL
  Administered 2015-03-31: 5 mg via ORAL
  Administered 2015-04-01 (×3): 10 mg via ORAL
  Filled 2015-03-31 (×5): qty 2

## 2015-03-31 MED ORDER — ASPIRIN EC 325 MG PO TBEC
325.0000 mg | DELAYED_RELEASE_TABLET | Freq: Every day | ORAL | Status: DC
  Administered 2015-03-31 – 2015-04-01 (×2): 325 mg via ORAL
  Filled 2015-03-31 (×2): qty 1

## 2015-03-31 MED ORDER — ACETAMINOPHEN 325 MG PO TABS: 650.0000 mg | ORAL_TABLET | Freq: Four times a day (QID) | ORAL | Status: DC | PRN

## 2015-03-31 MED ORDER — FENTANYL CITRATE (PF) 100 MCG/2ML IJ SOLN
INTRAMUSCULAR | Status: DC | PRN
  Administered 2015-03-31 (×2): 50 ug via INTRAVENOUS
  Administered 2015-03-31: 100 ug via INTRAVENOUS
  Administered 2015-03-31: 50 ug via INTRAVENOUS

## 2015-03-31 MED ORDER — SERTRALINE HCL 50 MG PO TABS
50.0000 mg | ORAL_TABLET | Freq: Every day | ORAL | Status: DC
Start: 2015-04-01 — End: 2015-04-01
  Administered 2015-04-01: 50 mg via ORAL
  Filled 2015-03-31: qty 1

## 2015-03-31 MED ORDER — CEFAZOLIN SODIUM-DEXTROSE 2-3 GM-% IV SOLR
INTRAVENOUS | Status: AC
  Filled 2015-03-31: qty 50

## 2015-03-31 MED ORDER — ONDANSETRON HCL 4 MG/2ML IJ SOLN: 4.0000 mg | Freq: Four times a day (QID) | INTRAMUSCULAR | Status: DC | PRN

## 2015-03-31 MED ORDER — LIDOCAINE HCL (CARDIAC) 20 MG/ML IV SOLN
INTRAVENOUS | Status: DC | PRN
  Administered 2015-03-31: 75 mg via INTRAVENOUS

## 2015-03-31 MED ORDER — CEFAZOLIN SODIUM-DEXTROSE 2-3 GM-% IV SOLR
2.0000 g | Freq: Four times a day (QID) | INTRAVENOUS | Status: AC
  Administered 2015-03-31 – 2015-04-01 (×3): 2 g via INTRAVENOUS
  Filled 2015-03-31 (×3): qty 50

## 2015-03-31 MED ORDER — OXYCODONE HCL 5 MG PO TABS
ORAL_TABLET | ORAL | Status: AC
  Filled 2015-03-31: qty 1

## 2015-03-31 MED ORDER — ONDANSETRON HCL 4 MG/2ML IJ SOLN
INTRAMUSCULAR | Status: DC | PRN
  Administered 2015-03-31: 4 mg via INTRAVENOUS

## 2015-03-31 SURGICAL SUPPLY — 45 items
BANDAGE ESMARK 6X9 LF (GAUZE/BANDAGES/DRESSINGS) ×1 IMPLANT
BIT DRILL CANN LRG QC 5X300 (BIT) ×2 IMPLANT
BLADE SAW SGTL HD 18.5X60.5X1. (BLADE) ×3 IMPLANT
BLADE SURG 10 STRL SS (BLADE) ×2 IMPLANT
BNDG CMPR 9X6 STRL LF SNTH (GAUZE/BANDAGES/DRESSINGS) ×1
BNDG COHESIVE 4X5 TAN STRL (GAUZE/BANDAGES/DRESSINGS) ×3 IMPLANT
BNDG COHESIVE 6X5 TAN STRL LF (GAUZE/BANDAGES/DRESSINGS) ×2 IMPLANT
BNDG ESMARK 6X9 LF (GAUZE/BANDAGES/DRESSINGS) ×3
BNDG GAUZE ELAST 4 BULKY (GAUZE/BANDAGES/DRESSINGS) ×3 IMPLANT
COVER MAYO STAND STRL (DRAPES) ×3 IMPLANT
COVER SURGICAL LIGHT HANDLE (MISCELLANEOUS) ×6 IMPLANT
DRAPE OEC MINIVIEW 54X84 (DRAPES) ×2 IMPLANT
DRAPE U-SHAPE 47X51 STRL (DRAPES) ×3 IMPLANT
DRSG ADAPTIC 3X8 NADH LF (GAUZE/BANDAGES/DRESSINGS) ×3 IMPLANT
DRSG PAD ABDOMINAL 8X10 ST (GAUZE/BANDAGES/DRESSINGS) ×2 IMPLANT
DURAPREP 26ML APPLICATOR (WOUND CARE) ×3 IMPLANT
ELECT REM PT RETURN 9FT ADLT (ELECTROSURGICAL) ×3
ELECTRODE REM PT RTRN 9FT ADLT (ELECTROSURGICAL) ×1 IMPLANT
GAUZE SPONGE 4X4 12PLY STRL (GAUZE/BANDAGES/DRESSINGS) ×3 IMPLANT
GLOVE BIOGEL PI IND STRL 9 (GLOVE) ×1 IMPLANT
GLOVE BIOGEL PI INDICATOR 9 (GLOVE) ×2
GLOVE SURG ORTHO 9.0 STRL STRW (GLOVE) ×3 IMPLANT
GOWN STRL REUS W/ TWL LRG LVL3 (GOWN DISPOSABLE) ×1 IMPLANT
GOWN STRL REUS W/ TWL XL LVL3 (GOWN DISPOSABLE) ×1 IMPLANT
GOWN STRL REUS W/TWL LRG LVL3 (GOWN DISPOSABLE) ×3
GOWN STRL REUS W/TWL XL LVL3 (GOWN DISPOSABLE) ×3
GUIDEWIRE NON THREAD 1.6MM (WIRE) ×3 IMPLANT
GUIDEWIRE THREADED 2.8 (WIRE) ×4 IMPLANT
KIT BASIN OR (CUSTOM PROCEDURE TRAY) ×3 IMPLANT
KIT ROOM TURNOVER OR (KITS) ×3 IMPLANT
NS IRRIG 1000ML POUR BTL (IV SOLUTION) ×3 IMPLANT
PACK ORTHO EXTREMITY (CUSTOM PROCEDURE TRAY) ×3 IMPLANT
PAD ARMBOARD 7.5X6 YLW CONV (MISCELLANEOUS) ×6 IMPLANT
PUTTY BONE DBX 5CC MIX (Putty) ×2 IMPLANT
SCREW 6.5X70MM (Screw) ×3 IMPLANT
SCREW COMP HEADLEASS 4.5X50 (Screw) ×4 IMPLANT
SCREW HL THREAD 6.5X65 (Screw) ×2 IMPLANT
SPONGE LAP 18X18 X RAY DECT (DISPOSABLE) ×3 IMPLANT
SUCTION FRAZIER TIP 10 FR DISP (SUCTIONS) ×3 IMPLANT
SUT ETHILON 2 0 PSLX (SUTURE) ×9 IMPLANT
TOWEL OR 17X24 6PK STRL BLUE (TOWEL DISPOSABLE) ×3 IMPLANT
TOWEL OR 17X26 10 PK STRL BLUE (TOWEL DISPOSABLE) ×3 IMPLANT
TUBE CONNECTING 12'X1/4 (SUCTIONS) ×1
TUBE CONNECTING 12X1/4 (SUCTIONS) ×2 IMPLANT
WATER STERILE IRR 1000ML POUR (IV SOLUTION) ×3 IMPLANT

## 2015-03-31 NOTE — Anesthesia Procedure Notes (Addendum)
Procedure Name: Intubation Date/Time: 03/31/2015 10:54 AM Performed by: Scheryl Darter Pre-anesthesia Checklist: Patient identified, Emergency Drugs available, Suction available, Patient being monitored and Timeout performed Patient Re-evaluated:Patient Re-evaluated prior to inductionOxygen Delivery Method: Circle system utilized Preoxygenation: Pre-oxygenation with 100% oxygen Intubation Type: IV induction Ventilation: Mask ventilation without difficulty Laryngoscope Size: Miller and 2 Grade View: Grade I Tube type: Oral Tube size: 7.0 mm Number of attempts: 1 Airway Equipment and Method: Stylet Placement Confirmation: ETT inserted through vocal cords under direct vision,  positive ETCO2 and breath sounds checked- equal and bilateral Secured at: 22 cm Tube secured with: Tape Dental Injury: Teeth and Oropharynx as per pre-operative assessment    Anesthesia Regional Block:  Popliteal block  Pre-Anesthetic Checklist: ,, timeout performed, Correct Patient, Correct Site, Correct Laterality, Correct Procedure, Correct Position, site marked, Risks and benefits discussed,  Surgical consent,  Pre-op evaluation,  At surgeon's request and post-op pain management  Laterality: Right  Prep: chloraprep       Needles:  Injection technique: Single-shot  Needle Type: Stimulator Needle - 80          Additional Needles:  Procedures: Doppler guided, ultrasound guided (picture in chart) and nerve stimulator Popliteal block Narrative:  Start time: 03/31/2015 10:00 AM End time: 03/31/2015 10:20 AM Injection made incrementally with aspirations every 5 mL.  Performed by: Personally  Anesthesiologist: Finis Bud

## 2015-03-31 NOTE — Op Note (Signed)
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 female with a diagnosis of Posterior Tibial Tendon Insufficiency who failed conservative measures and elected for surgical management.    The risks benefits and alternatives were discussed with the patient preoperatively including but not limited to the risks of infection, bleeding, nerve injury, cardiopulmonary complications, the need for revision surgery, among others, and the patient was willing to proceed.  OPERATIVE IMPLANTS: 4.5 headless screws 2, 6.5 headless screws 2  OPERATIVE FINDINGS: Subluxation of the talonavicular joint as well as lateral subluxation of the posterior facet subtalar joint  OPERATIVE PROCEDURE: Patient was brought to the operating room and underwent a general anesthetic after a popliteal block. After adequate levels anesthesia were obtained patient's right lower extremity was prepped using DuraPrep draped into a sterile field. A timeout was called. An oblique incision was made over the sinus Tarsi this was carried down to the extensor muscles these were elevated using a saw osteotome and curet the posterior facet was debrided of articular cartilage. This was irrigated with normal saline. Tendons was then focused on the talonavicular joint. The joint was exposed protected and articular cartilage was removed from the talonavicular joint with both curette and osteotomes. This was irrigated with normal saline. The foot was then reduced with the pronation and valgus removed and the foot at neutral pronation and neutral valgus the talonavicular joint was stabilized with 2 K wires and the subtalar joint was stabilized with 2 K wires. C-arm  floss be verified alignment. The talonavicular joint was then secured with 2 compression 4.5 headless screws and the subtalar joint was compressed using 6.5 headless screws. C-arm fluoroscopy verified alignment. The wounds were again irrigated with normal saline. Incisions were closed using 2-0 nylon. Sterile compressive dressing was applied. Patient was extubated taken to the PACU in stable condition.

## 2015-03-31 NOTE — H&P (Signed)
Virginia Rios is an 59 y.o. female.   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 Patient has failed conservative treatment and presents at this time for  fusion of the talonavicular and subtalar joint.  Past Medical History  Diagnosis Date  . GERD (gastroesophageal reflux disease)   . Hyperlipidemia     used to take lipitor  . Hypertension   . Depression   . Arthritis     Past Surgical History  Procedure Laterality Date  . Tubal ligation      1980s  . Carpal tunnel release      left hand  . Dilation and curettage of uterus      2015    Family History  Problem Relation Age of Onset  . Alzheimer's disease Mother   . Heart disease Mother   . Diabetes Mother   . Kidney disease Mother   . Hypertension Mother   . Stroke Mother   . Alcohol abuse Father   . Heart disease Father   . Hyperlipidemia Sister   . Hypertension Sister   . Hyperlipidemia Brother   . Hypertension Brother   . Cancer Paternal Aunt     ovarian cancer in her 67s   Social History:  reports that she has never smoked. She does not have any smokeless tobacco history on file. She reports that she does not drink alcohol or use illicit drugs.  Allergies: No Known Allergies  No prescriptions prior to admission    No results found for this or any previous visit (from the past 48 hour(s)). No results found.  Review of Systems  All other systems reviewed and are negative.   There were no vitals taken for this visit. Physical Exam  On examination patient has a good dorsalis pedis pulse. She has pronation and valgus of the forefoot. She has inability to perform a single limb heel raise and pain to palpation over the sinus Tarsi and talonavicular joint. Assessment/Plan Assessment: Right foot posterior  tibial tendon insufficiency with pronation and valgus deformity to the forefoot.  Plan: We will plan for right talonavicular and subtalar fusion. Risks and benefits were discussed patient states she understands and wishes to proceed at this time.  DUDA,MARCUS V 03/31/2015, 6:43 AM

## 2015-03-31 NOTE — Transfer of Care (Signed)
Immediate Anesthesia Transfer of Care Note  Patient: Virginia Rios  Procedure(s) Performed: Procedure(s): Right Talonavicular and Subtalar Fusion (Right)  Patient Location: PACU  Anesthesia Type:General and Regional  Level of Consciousness: awake, alert , oriented and sedated  Airway & Oxygen Therapy: Patient Spontanous Breathing and Patient connected to nasal cannula oxygen  Post-op Assessment: Report given to RN, Post -op Vital signs reviewed and stable and Patient moving all extremities  Post vital signs: Reviewed and stable  Last Vitals:  Filed Vitals:   03/31/15 1215  BP:   Pulse: 87  Temp: 36.4 C  Resp: 17    Complications: No apparent anesthesia complications

## 2015-03-31 NOTE — Anesthesia Preprocedure Evaluation (Addendum)
Anesthesia Evaluation  Patient identified by MRN, date of birth, ID band Patient awake    Reviewed: Allergy & Precautions, NPO status , Patient's Chart, lab work & pertinent test results  Airway Mallampati: II  TM Distance: >3 FB Neck ROM: Full    Dental   Pulmonary neg pulmonary ROS,    breath sounds clear to auscultation       Cardiovascular hypertension,  Rhythm:Regular Rate:Normal     Neuro/Psych    GI/Hepatic Neg liver ROS, GERD  ,  Endo/Other  negative endocrine ROS  Renal/GU negative Renal ROS     Musculoskeletal   Abdominal   Peds  Hematology   Anesthesia Other Findings   Reproductive/Obstetrics                            Anesthesia Physical Anesthesia Plan  ASA: III  Anesthesia Plan: General and Regional   Post-op Pain Management: GA combined w/ Regional for post-op pain   Induction: Intravenous  Airway Management Planned: Oral ETT  Additional Equipment:   Intra-op Plan:   Post-operative Plan: Extubation in OR  Informed Consent: I have reviewed the patients History and Physical, chart, labs and discussed the procedure including the risks, benefits and alternatives for the proposed anesthesia with the patient or authorized representative who has indicated his/her understanding and acceptance.   Dental advisory given  Plan Discussed with: Anesthesiologist and CRNA  Anesthesia Plan Comments:         Anesthesia Quick Evaluation

## 2015-03-31 NOTE — Progress Notes (Signed)
Orthopedic Tech Progress Note Patient Details:  Virginia Rios 1955/10/04 [id number]  Ortho Devices Type of Ortho Device: CAM walker Ortho Device/Splint Location: rle Ortho Device/Splint Interventions: Application   [name] 03/31/2015, 12:36 PM

## 2015-03-31 NOTE — Anesthesia Postprocedure Evaluation (Signed)
  Anesthesia Post-op Note  Patient: Virginia Rios  Procedure(s) Performed: Procedure(s): Right Talonavicular and Subtalar Fusion (Right)  Patient Location: PACU  Anesthesia Type:General  Level of Consciousness: awake  Airway and Oxygen Therapy: Patient Spontanous Breathing  Post-op Pain: mild  Post-op Assessment: Post-op Vital signs reviewed     RLE Motor Response: Purposeful movement, Responds to commands (wiggles toes) RLE Sensation: Pain, Decreased, Tingling (tingling in big toe d/t block)      Post-op Vital Signs: Reviewed  Last Vitals:  Filed Vitals:   03/31/15 1400  BP: 138/69  Pulse: 69  Temp:   Resp: 14    Complications: No apparent anesthesia complications

## 2015-04-01 ENCOUNTER — Encounter (HOSPITAL_COMMUNITY): Payer: Self-pay | Admitting: Orthopedic Surgery

## 2015-04-01 DIAGNOSIS — M76821 Posterior tibial tendinitis, right leg: Secondary | ICD-10-CM | POA: Diagnosis not present

## 2015-04-01 MED ORDER — OXYCODONE-ACETAMINOPHEN 5-325 MG PO TABS: 1.0000 | ORAL_TABLET | ORAL | Status: DC | PRN

## 2015-04-01 MED ORDER — ASPIRIN EC 325 MG PO TBEC: 325.0000 mg | DELAYED_RELEASE_TABLET | Freq: Every day | ORAL | Status: DC

## 2015-04-01 NOTE — Care Management Note (Signed)
Case Management Note  Patient Details  Name: Virginia Rios MRN: [id number] Date of Birth: 05-Aug-1955  Subjective/Objective:    59 yr old female s/p right ankle fusion.                 Action/Plan:  Case manager spoke with patient concerning home health and DME needs. Choice was offered. Case manager called referral to [place] Liaison.    Expected Discharge Date:    04/01/15             Expected Discharge Plan:   Home with Home Health  In-House Referral:  NA  Discharge planning Services  CM Consult  Post Acute Care Choice:  Home Health, Durable Medical Equipment Choice offered to:  Patient  DME Arranged:  Wheelchair manual DME Agency:  [place]:  PT [place]:  [place]  Status of Service:  Completed, signed off  Medicare Important Message Given:    Date Medicare IM Given:    Medicare IM give by:    Date Additional Medicare IM Given:    Additional Medicare Important Message give by:     If discussed at [place] of Stay Meetings, dates discussed:    Additional Comments:  Ninfa Meeker, RN 04/01/2015, 10:19 AM

## 2015-04-01 NOTE — Discharge Summary (Signed)
Physician Discharge Summary  Patient ID: Virginia Rios MRN: [id number] DOB/AGE: Jun 19, 1955 59 y.o.  Admit date: 03/31/2015 Discharge date: 04/01/2015  Admission Diagnoses: Right posterior tibial tendon insufficiency  Discharge Diagnoses:  Active Problems:   Insufficiency of posterior tibialis tendon   Discharged Condition: stable  Hospital Course: Patient's hospital course was essentially unremarkable. She underwent subtalar and talonavicular fusion. Postoperatively patient progressed well and was discharged to home in stable condition.  Consults: None  Significant Diagnostic Studies: labs: Routine labs  Treatments: surgery: See operative note  Discharge Exam: Blood pressure 107/44, pulse 73, temperature 98.6 F (37 C), temperature source Oral, resp. rate 15, height 5' (1.524 m), weight 111.267 kg (245 lb 4.8 oz), SpO2 100 %. Incision/Wound: incision clean and dry  Disposition: 01-Home or Self Care  Discharge Instructions    Call MD / Call 911    Complete by:  As directed   If you experience chest pain or shortness of breath, CALL 911 and be transported to the hospital emergency room.  If you develope a fever above 101 F, pus (white drainage) or increased drainage or redness at the wound, or calf pain, call your surgeon's office.     Constipation Prevention    Complete by:  As directed   Drink plenty of fluids.  Prune juice may be helpful.  You may use a stool softener, such as Colace (over the counter) 100 mg twice a day.  Use MiraLax (over the counter) for constipation as needed.     Diet - low sodium heart healthy    Complete by:  As directed      Increase activity slowly as tolerated    Complete by:  As directed      Non weight bearing    Complete by:  As directed   Laterality:  right  Extremity:  Lower            Medication List    TAKE these medications        albuterol 108 (90 BASE) MCG/ACT inhaler  Commonly known as:  PROVENTIL HFA;VENTOLIN HFA  Inhale  2 puffs into the lungs every 4 (four) hours as needed for wheezing.     aspirin EC 325 MG tablet  Take 1 tablet (325 mg total) by mouth daily.     lisinopril-hydrochlorothiazide 20-12.5 MG tablet  Commonly known as:  PRINZIDE,ZESTORETIC  Take 1 tablet by mouth daily.     meloxicam 15 MG tablet  Commonly known as:  MOBIC  Take 15 mg by mouth daily as needed for pain.     oxyCODONE-acetaminophen 5-325 MG tablet  Commonly known as:  ROXICET  Take 1 tablet by mouth every 4 (four) hours as needed for severe pain.     pantoprazole 40 MG tablet  Commonly known as:  PROTONIX  Take 40 mg by mouth daily.     sertraline 50 MG tablet  Commonly known as:  ZOLOFT  Take 50 mg by mouth daily.     simvastatin 40 MG tablet  Commonly known as:  ZOCOR  Take 40 mg by mouth daily.           Follow-up Information    Follow up with DUDA,MARCUS V, MD In 1 week.   Specialty:  Orthopedic Surgery   Contact information:   [place] [address] [phone]       Signed: Newt Minion 04/01/2015, 6:22 AM

## 2015-04-01 NOTE — Evaluation (Signed)
Physical Therapy Evaluation Patient Details Name: Virginia Rios MRN: [id number] DOB: 1956-05-26 Today's Date: 04/01/2015   History of Present Illness  Pt is a 59 yo female admitted with Insufficiency of the posterior tibialis tendon. Pt s/p subtalor and talonavicular fusion.  Clinical Impression  Pt is scheduled for d/c home today. Pt was unable to tolerate much activity this morning due to feeling nausea and lightheadedness. Pt demonstrated safe technique and was able to maintain NWB during short distances with RW. Pt will have 24 hour assistance from her spouse at home. I would recommend a w/c for home and community use due to NWB. Pt will need a 20X18 w/c. Pt is also interested in getting a knee scooter for improved mobility. Pt has a cam boot in her room, but it is not the accurate size. I have spoken with the RN regarding the cam boot. Pt does has a flight of steps to get to her room. Pt reports she may be able to stay downstairs for a couple of days. Pt is unable to tolerate step training at this time. I would recommend an order for HHPT to progress mobility and step training secondary to d/c home today.     Follow Up Recommendations Home health PT    Equipment Recommendations  Wheelchair (measurements PT);Other (comment) (knee scooter, w/c 20x18, )    Recommendations for Other Services       Precautions / Restrictions Precautions Required Braces or Orthoses: Other Brace/Splint Other Brace/Splint: cam boot Restrictions Weight Bearing Restrictions: Yes RLE Weight Bearing: Non weight bearing      Mobility  Bed Mobility Overal bed mobility: Modified Independent                Transfers Overall transfer level: Modified independent Equipment used: Rolling walker (2 wheeled) Transfers: Sit to/from [facility]   Stand pivot transfers: Modified independent (Device/Increase time)       General transfer comment: was able to maintain NWB, cues for hand  placement for increased safety.  Ambulation/Gait Ambulation/Gait assistance: Min guard Ambulation Distance (Feet): 6 Feet Assistive device: Rolling walker (2 wheeled)       General Gait Details: lightheadedness with gait, cam boot was not the appropriate size; therefore, we ambulated without the boot completely maintaining NWB. Will notify RN to get the accurate size boot.  Stairs Stairs:  (discussed technique, unable to practice due to nausea/lightheadedness)          Wheelchair Mobility    Modified Rankin (Stroke Patients Only)       Balance Overall balance assessment: No apparent balance deficits (not formally assessed)                                           Pertinent Vitals/Pain Pain Assessment: 0-10 Pain Score: 7  Pain Location: R foot Pain Descriptors / Indicators: Aching Pain Intervention(s): Limited activity within patient's tolerance;Monitored during session    Home Living Family/patient expects to be discharged to:: Private residence Living Arrangements: Spouse/significant other Available Help at Discharge: Family Type of Home: House Home Access: Level entry     Home Layout: Two level Home Equipment: Environmental consultant - 2 wheels      Prior Function Level of Independence: Independent               Hand Dominance        Extremity/Trunk Assessment   Upper  Extremity Assessment: Defer to OT evaluation           Lower Extremity Assessment: Difficult to assess due to impaired cognition         Communication   Communication: No difficulties  Cognition Arousal/Alertness: Awake/alert Behavior During Therapy: WFL for tasks assessed/performed Overall Cognitive Status: Within Functional Limits for tasks assessed                      General Comments      Exercises        Assessment/Plan    PT Assessment All further PT needs can be met in the next venue of care  PT Diagnosis Difficulty walking   PT Problem  List Decreased activity tolerance;Decreased mobility;Pain  PT Treatment Interventions     PT Goals (Current goals can be found in the Care Plan section) Acute Rehab PT Goals Patient Stated Goal: To walk and go home.    Frequency     Barriers to discharge        Co-evaluation               End of Session Equipment Utilized During Treatment: Gait belt Activity Tolerance: Patient tolerated treatment well Patient left: in chair;with call bell/phone within reach Nurse Communication: Mobility status    Functional Assessment Tool Used: clinical observation Functional Limitation: Mobility: Walking and moving around Mobility: Walking and Moving Around Current Status (O0600): At least 1 percent but less than 20 percent impaired, limited or restricted Mobility: Walking and Moving Around Goal Status [phone]): At least 1 percent but less than 20 percent impaired, limited or restricted Mobility: Walking and Moving Around Discharge Status [phone]): At least 1 percent but less than 20 percent impaired, limited or restricted    Time: 0755-0827 PT Time Calculation (min) (ACUTE ONLY): 32 min   Charges:   PT Evaluation $Initial PT Evaluation Tier I: 1 Procedure PT Treatments $Gait Training: 8-22 mins   PT G Codes:   PT G-Codes **NOT FOR INPATIENT CLASS** Functional Assessment Tool Used: clinical observation Functional Limitation: Mobility: Walking and moving around Mobility: Walking and Moving Around Current Status (L9532): At least 1 percent but less than 20 percent impaired, limited or restricted Mobility: Walking and Moving Around Goal Status [phone]): At least 1 percent but less than 20 percent impaired, limited or restricted Mobility: Walking and Moving Around Discharge Status [phone]): At least 1 percent but less than 20 percent impaired, limited or restricted    Lelon Mast 04/01/2015, 8:46 AM

## 2015-04-01 NOTE — Discharge Planning (Signed)
IV removed. Discharge instructions, case mamangement papers, and prescriptions given to patient. No questions or concerns at this time. Taken out via wheelchair by staff. Wheelchair used to transport is patient's and was sent with her. Husband taking the patient home.

## 2015-09-29 ENCOUNTER — Encounter: Payer: Self-pay | Admitting: Internal Medicine

## 2017-04-18 IMAGING — CR DG CHEST 2V
2 series · 2 of 2 positions shown · non-contrast
Comparison: April 15, 2012

CLINICAL DATA: Pre of mid for right ankle surgery. No chest
complaints. History of hypertension.

EXAM:
CHEST  2 VIEW

[w chest pa]
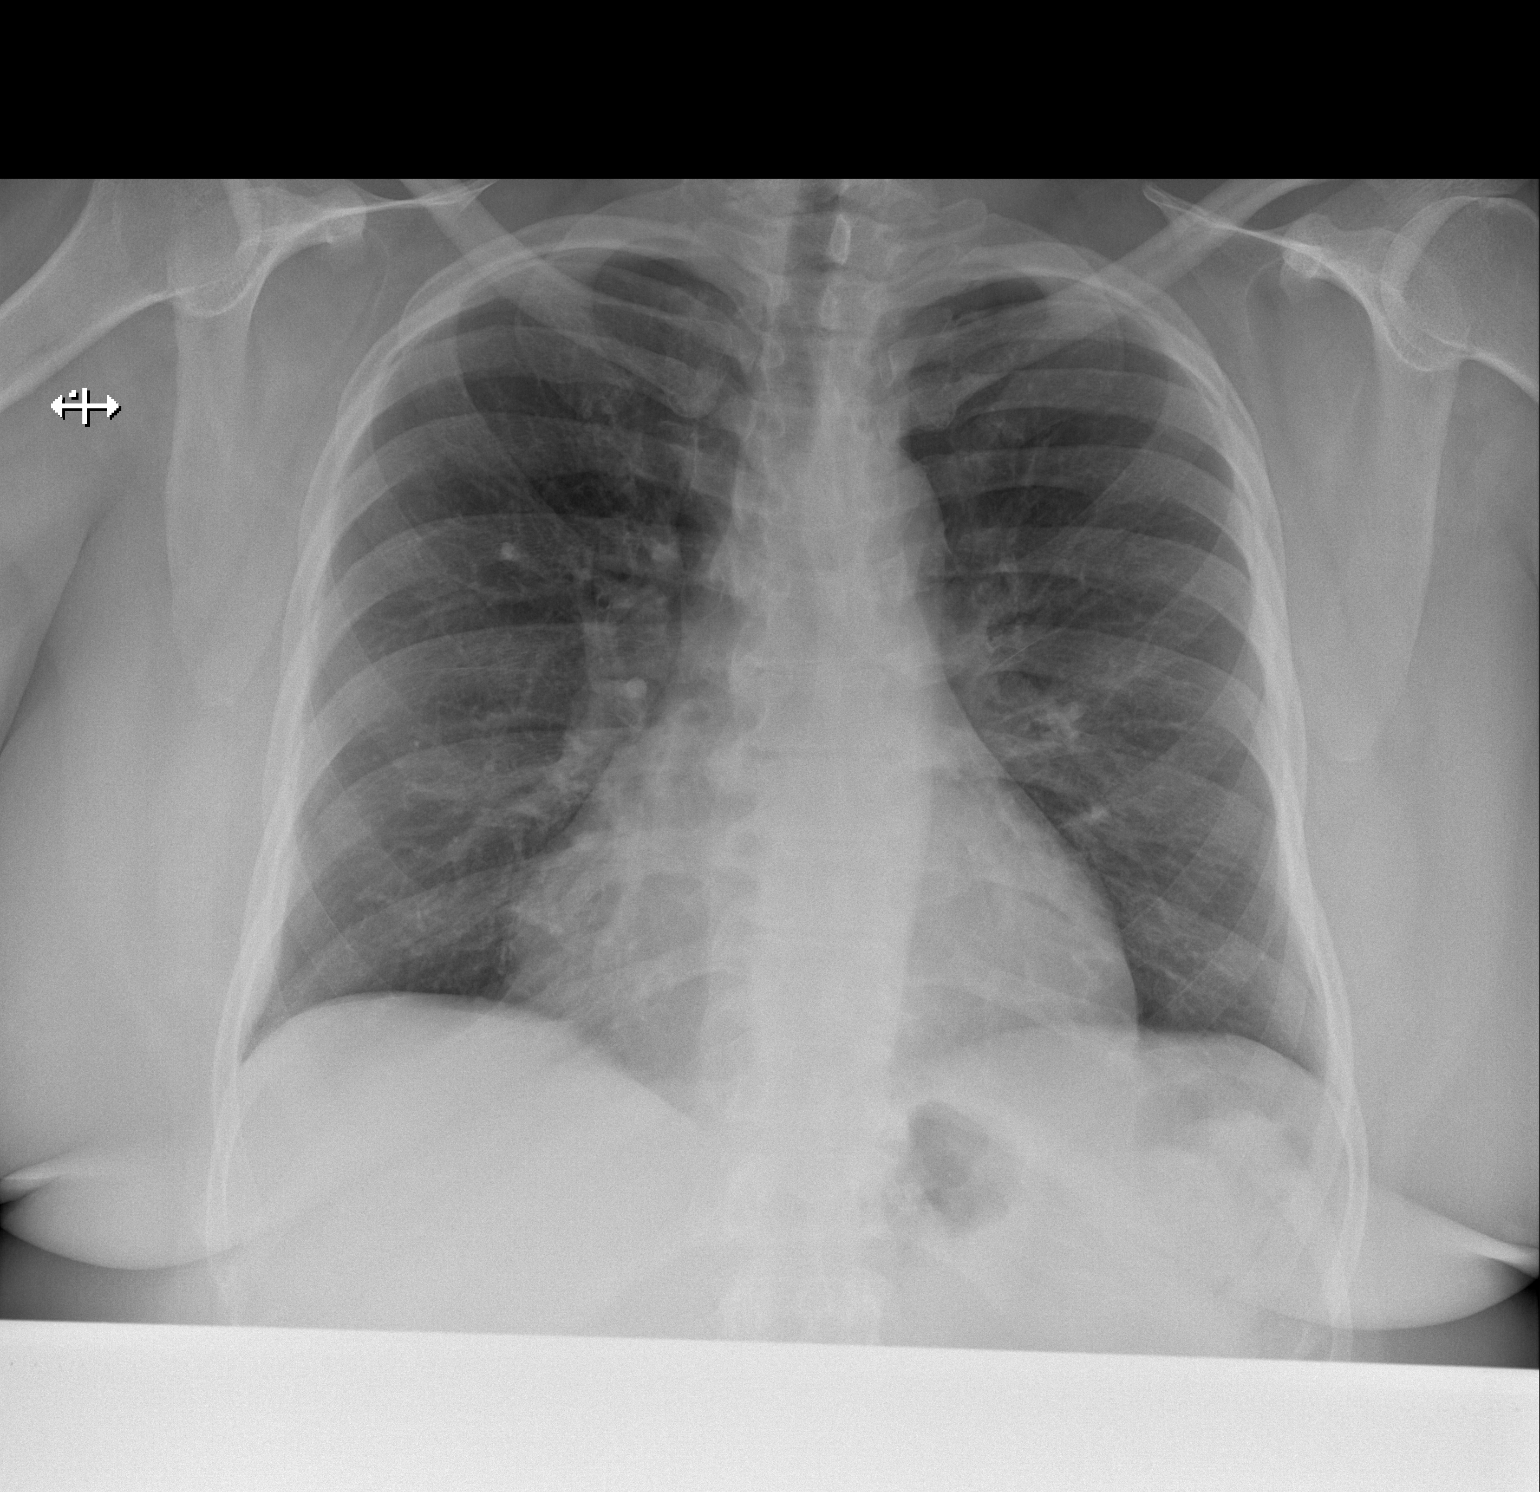

[w chest lat]
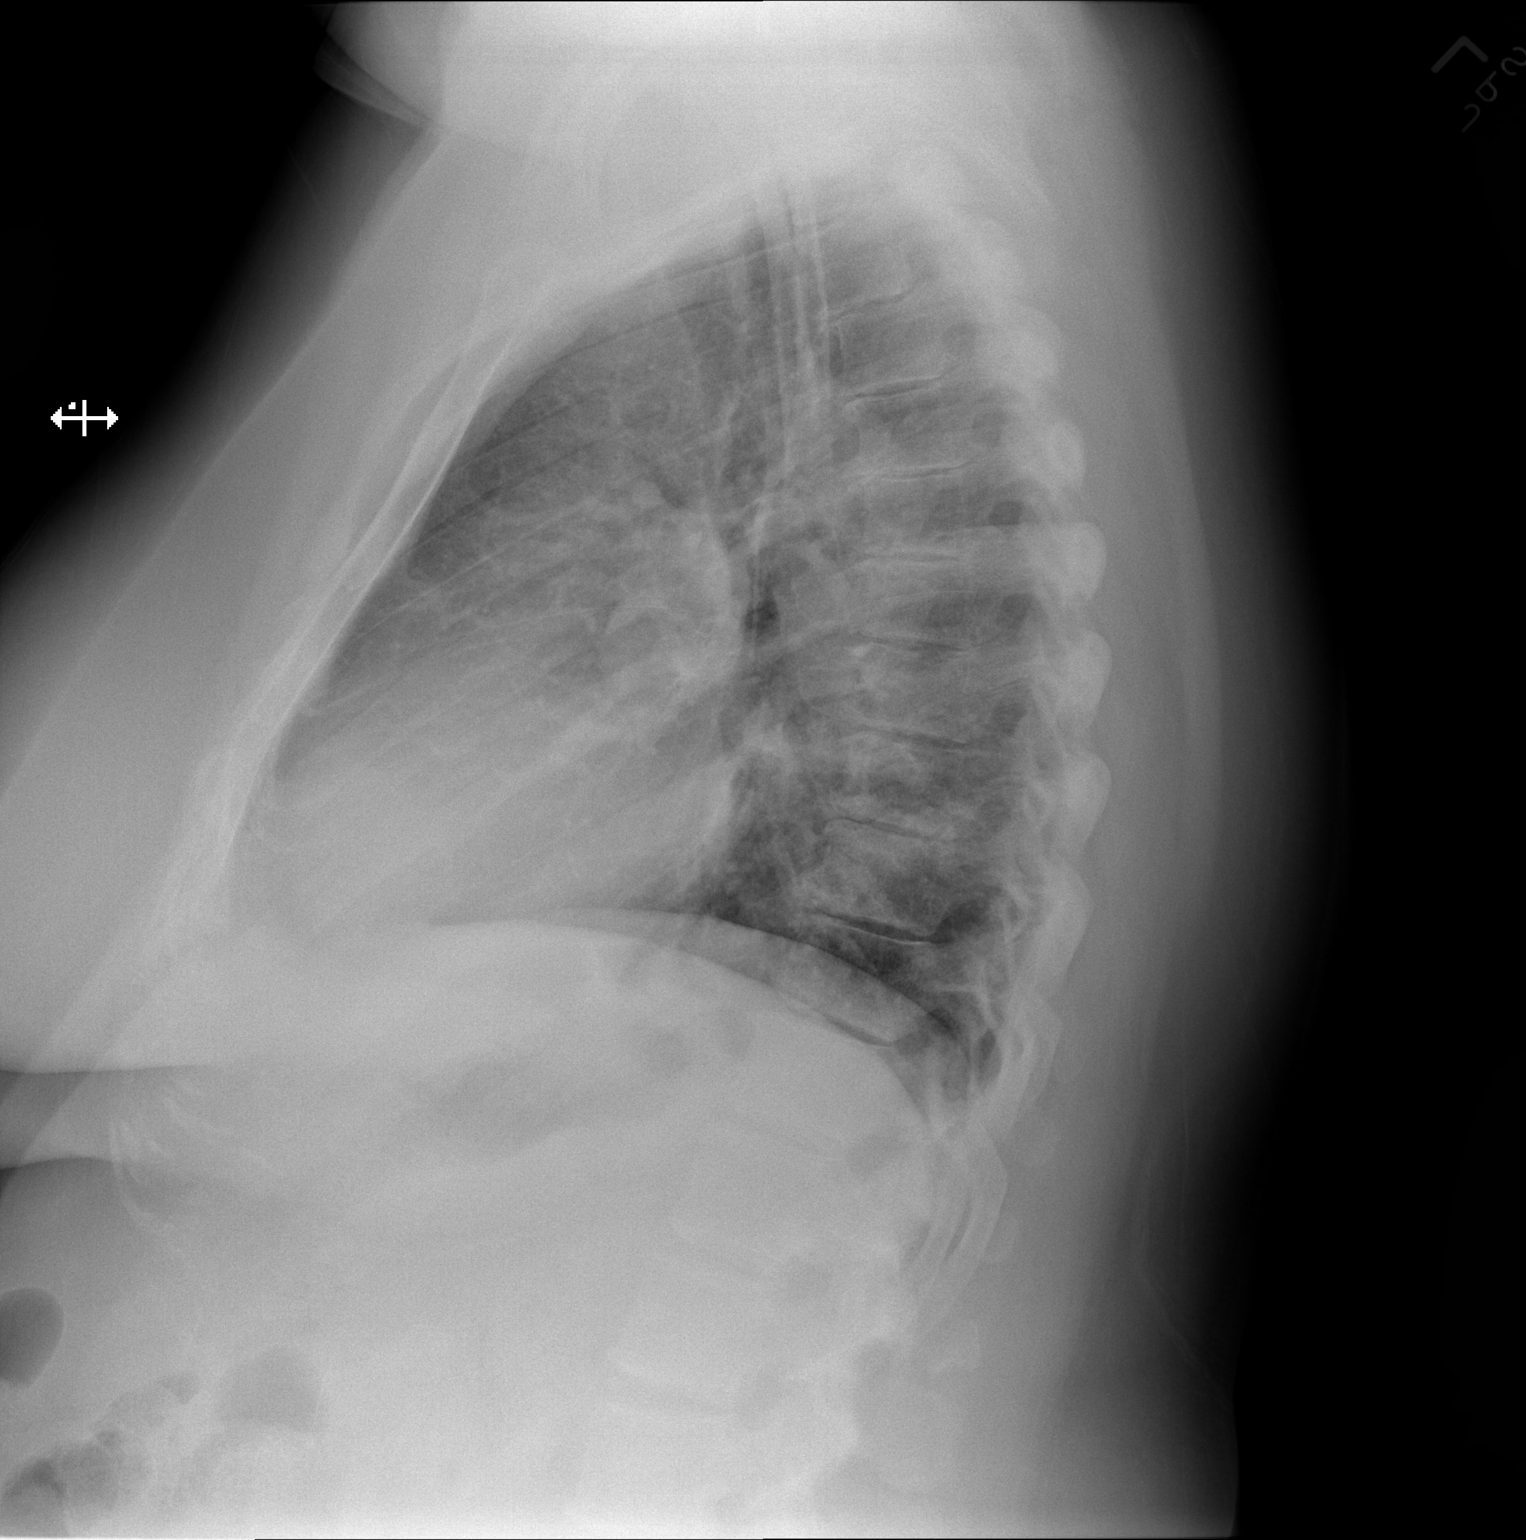

[2 of 2 positions shown; findings below may reference images not displayed]

FINDINGS: The heart size and mediastinal contours are within normal limits.
There is no focal infiltrate, pulmonary edema, or pleural effusion.
There is a 3 mm nodule in the right upper lung zone. The visualized
skeletal structures are unremarkable.
IMPRESSION: No active cardiopulmonary disease.

3 mm nodule in the right upper lung. Further evaluation with a chest
CT on outpatient basis is recommended.

## 2017-08-15 ENCOUNTER — Ambulatory Visit (HOSPITAL_COMMUNITY)
Admission: EM | Admit: 2017-08-15 | Discharge: 2017-08-15 | Disposition: A | Discharge status: Home / Self Care | Attending: Family Medicine | Admitting: Family Medicine

## 2017-08-15 ENCOUNTER — Encounter (HOSPITAL_COMMUNITY): Payer: Self-pay | Admitting: Emergency Medicine

## 2017-08-15 DIAGNOSIS — R69 Illness, unspecified: Secondary | ICD-10-CM

## 2017-08-15 DIAGNOSIS — J111 Influenza due to unidentified influenza virus with other respiratory manifestations: Secondary | ICD-10-CM

## 2017-08-15 MED ORDER — OSELTAMIVIR PHOSPHATE 75 MG PO CAPS: 75.0000 mg | ORAL_CAPSULE | Freq: Two times a day (BID) | ORAL | 0 refills | Status: AC

## 2017-08-15 MED ORDER — HYDROCODONE-HOMATROPINE 5-1.5 MG/5ML PO SYRP: 5.0000 mL | ORAL_SOLUTION | Freq: Four times a day (QID) | ORAL | 0 refills | Status: DC | PRN

## 2017-08-15 NOTE — ED Provider Notes (Signed)
[facility]   [id number] 08/15/17 Arrival Time: 1884  ASSESSMENT & PLAN:  1. Influenza-like illness     Meds ordered this encounter  Medications  . HYDROcodone-homatropine (HYCODAN) 5-1.5 MG/5ML syrup    Sig: Take 5 mLs by mouth every 6 (six) hours as needed for cough.    Dispense:  90 mL    Refill:  0  . oseltamivir (TAMIFLU) 75 MG capsule    Sig: Take 1 capsule (75 mg total) by mouth 2 (two) times daily for 5 days.    Dispense:  10 capsule    Refill:  0   Cough medication sedation precautions. Discussed typical duration of symptoms. OTC symptom care as needed. Ensure adequate fluid intake and rest. May f/u with PCP or here as needed.  Reviewed expectations re: course of current medical issues. Questions answered. Outlined signs and symptoms indicating need for more acute intervention. Patient verbalized understanding. After Visit Summary given.   SUBJECTIVE: History from: patient.  Virginia Rios is a 62 y.o. female who presents with complaint of nasal congestion, post-nasal drainage, and a persistent dry cough. Onset abrupt, approximately 1 day ago. Overall fatigued with body aches. SOB: none. Wheezing: none. Fever: yes, subjective with chills. Overall normal PO intake without n/v. Sick contacts: no. OTC treatment: Tylenol with mild relief.  Received flu shot this year: yes.  Social History   Tobacco Use  Smoking Status Never Smoker    ROS: As per HPI.   OBJECTIVE:  Vitals:   08/15/17 1323 08/15/17 1324 08/15/17 1326  BP:   116/63  Pulse:   67  Resp: 16  16  Temp: 98.7 F (37.1 C)  98.7 F (37.1 C)  TempSrc: Oral  Oral  SpO2: 100%  100%  Weight:  240 lb (108.9 kg)      General appearance: alert; appears fatigued HEENT: nasal congestion; clear runny nose; throat irritation secondary to post-nasal drainage Neck: supple without LAD Lungs: unlabored respirations, symmetrical air entry; cough: moderate; no respiratory distress Skin: warm  and dry Psychological: alert and cooperative; normal mood and affect  Imaging: No results found.  No Known Allergies  Past Medical History:  Diagnosis Date  . Arthritis   . Depression   . GERD (gastroesophageal reflux disease)   . Hyperlipidemia    used to take lipitor  . Hypertension    Family History  Problem Relation Age of Onset  . Alzheimer's disease Mother   . Heart disease Mother   . Diabetes Mother   . Kidney disease Mother   . Hypertension Mother   . Stroke Mother   . Alcohol abuse Father   . Heart disease Father   . Hyperlipidemia Sister   . Hypertension Sister   . Hyperlipidemia Brother   . Hypertension Brother   . Cancer Paternal Aunt        ovarian cancer in her 80s   Social History   Socioeconomic History  . Marital status: Single    Spouse name: Not on file  . Number of children: Not on file  . Years of education: Not on file  . Highest education level: Not on file  Social Needs  . Financial resource strain: Not on file  . Food insecurity - worry: Not on file  . Food insecurity - inability: Not on file  . Transportation needs - medical: Not on file  . Transportation needs - non-medical: Not on file  Occupational History  . Not on file  Tobacco Use  .  Smoking status: Never Smoker  Substance and Sexual Activity  . Alcohol use: No  . Drug use: No  . Sexual activity: Not on file  Other Topics Concern  . Not on file  Social History Narrative  . Not on file           Vanessa Kick, MD 08/16/17 1005

## 2017-08-15 NOTE — Discharge Instructions (Signed)

## 2017-08-15 NOTE — ED Triage Notes (Signed)
PT reports sore throat, chills, bodyaches, phlegm, cough at night, bilateral ear pain.

## 2019-08-02 ENCOUNTER — Ambulatory Visit: Payer: TRICARE For Life (TFL) | Attending: Internal Medicine

## 2019-08-02 DIAGNOSIS — Z23 Encounter for immunization: Secondary | ICD-10-CM | POA: Insufficient documentation

## 2019-08-02 NOTE — Progress Notes (Signed)
   Covid-19 Vaccination Clinic  Name:  MODELL EISAMAN    MRN: [id number] DOB: [birth year]  08/02/2019  Ms. Biffle was observed post Covid-19 immunization for 15 minutes without incidence. She was provided with Vaccine Information Sheet and instruction to access the V-Safe system.   Ms. Blitzer was instructed to call 911 with any severe reactions post vaccine: Marland Kitchen Difficulty breathing  . Swelling of your face and throat  . A fast heartbeat  . A bad rash all over your body  . Dizziness and weakness    Immunizations Administered    Name Date Dose VIS Date Route   Pfizer COVID-19 Vaccine 08/02/2019 10:10 AM 0.3 mL 05/23/2019 Intramuscular   Manufacturer: [place]   Lot: [id number]   [place]: [id number]

## 2019-08-25 ENCOUNTER — Ambulatory Visit: Payer: TRICARE For Life (TFL) | Attending: Internal Medicine

## 2019-08-25 DIAGNOSIS — Z23 Encounter for immunization: Secondary | ICD-10-CM

## 2019-08-25 NOTE — Progress Notes (Signed)
   Covid-19 Vaccination Clinic  Name:  Virginia Rios    MRN: [id number] DOB: [birth year]  08/25/2019  [name] was observed post Covid-19 immunization for 15 minutes without incident. She was provided with Vaccine Information Sheet and instruction to access the V-Safe system.   [name] was instructed to call 911 with any severe reactions post vaccine: Marland Kitchen Difficulty breathing  . Swelling of face and throat  . A fast heartbeat  . A bad rash all over body  . Dizziness and weakness   Immunizations Administered    Name Date Dose VIS Date Route   Pfizer COVID-19 Vaccine 08/25/2019  8:58 AM 0.3 mL 05/23/2019 Intramuscular   Manufacturer: [place]   Lot: [id number]   [place]: [id number]

## 2019-10-20 ENCOUNTER — Ambulatory Visit: Payer: Self-pay

## 2019-10-20 ENCOUNTER — Ambulatory Visit (INDEPENDENT_AMBULATORY_CARE_PROVIDER_SITE_OTHER): Payer: Medicare Other | Admitting: Orthopedic Surgery

## 2019-10-20 ENCOUNTER — Ambulatory Visit (INDEPENDENT_AMBULATORY_CARE_PROVIDER_SITE_OTHER): Payer: Medicare Other

## 2019-10-20 ENCOUNTER — Other Ambulatory Visit: Payer: Self-pay

## 2019-10-20 ENCOUNTER — Encounter: Payer: Self-pay | Admitting: Orthopedic Surgery

## 2019-10-20 DIAGNOSIS — M25562 Pain in left knee: Secondary | ICD-10-CM

## 2019-10-20 DIAGNOSIS — M25561 Pain in right knee: Secondary | ICD-10-CM

## 2019-10-20 DIAGNOSIS — M170 Bilateral primary osteoarthritis of knee: Secondary | ICD-10-CM

## 2019-10-20 DIAGNOSIS — G8929 Other chronic pain: Secondary | ICD-10-CM | POA: Diagnosis not present

## 2019-10-20 NOTE — Progress Notes (Signed)
Office Visit Note   Patient: Virginia Rios           Date of Birth: [birth year]           MRN: [id number] Visit Date: 10/20/2019              Requested by: No referring provider defined for this encounter. PCP: System, Provider Not In  Chief Complaint  Patient presents with  . Left Knee - Pain  . Right Knee - Pain      HPI: Patient is a 64 year old woman who is seen for osteoarthritis both knees right worse than left.  Patient states she has undergone prolonged conservative care she has undergone steroid injections which have lasted several months and she is also used hyaluronic acid injections still with persistent pain.  Patient states that she has been exercising she has lost 35 pounds and she walks 3-4 times a week.  Patient has been undergoing her conservative treatment at [facility] orthopedics.  Patient states that her pain in her knees is an 8 out of 10.  Assessment & Plan: Visit Diagnoses:  1. Chronic pain of both knees   2. Bilateral primary osteoarthritis of knee     Plan: Discussed risks and benefits of total knee arthroplasty.  Discussed that we will need to get insurance authorization to proceed with surgery.  Patient states she would like to proceed with a right total knee arthroplasty as soon as possible.  Follow-Up Instructions: Return in about 2 weeks (around 11/03/2019) for Follow-up 2 weeks after surgery.Manson Passey Exam  Patient is alert, oriented, no adenopathy, well-dressed, normal affect, normal respiratory effort. Examination patient has an antalgic gait she uses a cane.  She does have an elevated BMI.  She has crepitation with range of motion of both knees worse in the right knee than the left.  There is a mild effusion collaterals and cruciates are stable in both knees.  She is tender to palpation of the medial lateral joint line worse on the right knee than the left knee.  Imaging: XR KNEE 3 VIEW LEFT  Result Date: 10/20/2019 Three-view radiographs  of the left knee shows varus alignment periarticular bony spurs with subcondylar sclerosis and cysts.  XR KNEE 3 VIEW RIGHT  Result Date: 10/20/2019 Three-view radiographs of the right knee shows lateral tracking of the patella with sclerotic changes.  There is varus alignment of the right knee with bone-on-bone contact the medial joint line with subcondylar sclerosis and bony spurs.  No images are attached to the encounter.  Labs: No results found for: HGBA1C, ESRSEDRATE, CRP, LABURIC, REPTSTATUS, GRAMSTAIN, CULT, LABORGA   Lab Results  Component Value Date   ALBUMIN 3.8 03/23/2015    No results found for: MG No results found for: VD25OH  No results found for: PREALBUMIN CBC EXTENDED Latest Ref Rng & Units 12/29/2011  WBC 4.0 - 10.5 K/uL 10.8(H)  RBC 3.87 - 5.11 MIL/uL 5.27(H)  HGB 12.0 - 15.0 g/dL 12.4  HCT 36.0 - 46.0 % 41.8  PLT 150 - 400 K/uL 241  NEUTROABS 1.7 - 7.7 K/uL 6.5  LYMPHSABS 0.7 - 4.0 K/uL 3.3     There is no height or weight on file to calculate BMI.  Orders:  Orders Placed This Encounter  Procedures  . XR KNEE 3 VIEW RIGHT  . XR KNEE 3 VIEW LEFT   No orders of the defined types were placed in this encounter.    Procedures: No procedures performed  Clinical Data: No additional findings.  ROS:  All other systems negative, except as noted in the HPI. Review of Systems  Objective: Vital Signs: There were no vitals taken for this visit.  Specialty Comments:  No specialty comments available.  PMFS History: Patient Active Problem List   Diagnosis Date Noted  . 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 Hypertension 12/29/2011  . Severe obesity (BMI >= 40) ([place]) 12/29/2011  . Knee pain 12/29/2011  . NEOPLASM, DIGESTIVE ORGANS,UNCERTAIN BEHAVIOR 0000000  . ABDOMINAL PAIN 06/25/2008  . DYSPHAGIA UNSPECIFIED 06/24/2008  . DYSPHAGIA 06/24/2008  . GERD 06/23/2008   Past Medical History:  Diagnosis Date  . Arthritis   .  Depression   . GERD (gastroesophageal reflux disease)   . Hyperlipidemia    used to take lipitor  . Hypertension     Family History  Problem Relation Age of Onset  . Alzheimer's disease Mother   . Heart disease Mother   . Diabetes Mother   . Kidney disease Mother   . Hypertension Mother   . Stroke Mother   . Alcohol abuse Father   . Heart disease Father   . Hyperlipidemia Sister   . Hypertension Sister   . Hyperlipidemia Brother   . Hypertension Brother   . Cancer Paternal Aunt        ovarian cancer in her 77s    Past Surgical History:  Procedure Laterality Date  . ANKLE FUSION Right 03/31/2015   Procedure: Right Talonavicular and Subtalar Fusion;  Surgeon: Newt Minion, MD;  Location: [place];  Service: Orthopedics;  Laterality: Right;  . CARPAL TUNNEL RELEASE     left hand  . DILATION AND CURETTAGE OF UTERUS     2015  . TUBAL LIGATION     1980s   Social History   Occupational History  . Not on file  Tobacco Use  . Smoking status: Never Smoker  Substance and Sexual Activity  . Alcohol use: No  . Drug use: No  . Sexual activity: Not on file

## 2020-01-22 ENCOUNTER — Telehealth: Payer: Self-pay | Admitting: Orthopedic Surgery

## 2020-01-22 NOTE — Telephone Encounter (Signed)
Looks like back in May of this year pt wanted to proceed with a total knee but needed insurance auth first. I do not see where she has called back or come back into the office but states that she is wanting to get this set up. Do you need a blue sheet or does she need to come back in?

## 2020-01-22 NOTE — Telephone Encounter (Signed)
Pt states she has been calling trying to get scheduled for surgery and has not heard anything back; pt would like a CB to set up surgery please  [phone]

## 2020-01-30 ENCOUNTER — Ambulatory Visit (INDEPENDENT_AMBULATORY_CARE_PROVIDER_SITE_OTHER): Payer: Medicare Other | Admitting: Family

## 2020-01-30 ENCOUNTER — Ambulatory Visit (INDEPENDENT_AMBULATORY_CARE_PROVIDER_SITE_OTHER): Payer: Medicare Other

## 2020-01-30 VITALS — Ht 60.0 in | Wt 240.0 lb

## 2020-01-30 DIAGNOSIS — M25572 Pain in left ankle and joints of left foot: Secondary | ICD-10-CM

## 2020-02-03 ENCOUNTER — Encounter: Payer: Self-pay | Admitting: Family

## 2020-02-03 NOTE — Progress Notes (Signed)
Office Visit Note   Patient: Virginia Rios           Date of Birth: 08-08-1955           MRN: [id number] Visit Date: 01/30/2020              Requested by: No referring provider defined for this encounter. PCP: System, Provider Not In  Chief Complaint  Patient presents with  . Right Ankle - Pain    Hx right talonavicular and subtalar fusion       HPI: The patient is a 64 year old woman seen today for new lateral ankle pain.  She was walking and feels she may have twisted her ankle cannot recall the specifics of the injury.  She has been having some lateral ankle swelling and stabbing to the lateral ankle since.  Worse pain with ambulation.  Assessment & Plan: Visit Diagnoses:  1. Pain in left ankle and joints of left foot     Plan: Reassurance provided.  Did provide her with a lace up ankle support, ASO.  She will wear this for the next couple weeks discussed that ideally 4 weeks out from her injury she will not be wearing this, she is doing something like exercise or walking on uneven terrain.  Reassurance that her hardware and ankle fusion is stable.  Follow-Up Instructions: Return in about 3 weeks (around 02/20/2020), or if symptoms worsen or fail to improve.   Right Ankle Exam   Tenderness  The patient is experiencing tenderness in the ATF. Swelling: mild  Range of Motion  The patient has normal right ankle ROM.  Muscle Strength  The patient has normal right ankle strength.  Tests  Varus tilt: 1+  Other  Erythema: absent Pulse: present       Patient is alert, oriented, no adenopathy, well-dressed, normal affect, normal respiratory effort.   Imaging: No results found. No images are attached to the encounter.  Labs: No results found for: HGBA1C, ESRSEDRATE, CRP, LABURIC, REPTSTATUS, GRAMSTAIN, CULT, LABORGA   Lab Results  Component Value Date   ALBUMIN 3.8 03/23/2015    No results found for: MG No results found for: VD25OH  No results found  for: PREALBUMIN CBC EXTENDED Latest Ref Rng & Units 12/29/2011  WBC 4.0 - 10.5 K/uL 10.8(H)  RBC 3.87 - 5.11 MIL/uL 5.27(H)  HGB 12.0 - 15.0 g/dL 12.4  HCT 36 - 46 % 41.8  PLT 150 - 400 K/uL 241  NEUTROABS 1.7 - 7.7 K/uL 6.5  LYMPHSABS 0.7 - 4.0 K/uL 3.3     Body mass index is 46.87 kg/m.  Orders:  Orders Placed This Encounter  Procedures  . XR Ankle Complete Right   No orders of the defined types were placed in this encounter.    Procedures: No procedures performed  Clinical Data: No additional findings.  ROS:  All other systems negative, except as noted in the HPI. Review of Systems  Constitutional: Negative for chills and fever.  Musculoskeletal: Positive for gait problem, joint swelling and myalgias.    Objective: Vital Signs: Ht 5' (1.524 m)   Wt 240 lb (108.9 kg)   BMI 46.87 kg/m   Specialty Comments:  No specialty comments available.  PMFS History: Patient Active Problem List   Diagnosis Date Noted  . 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 Hypertension 12/29/2011  . Severe obesity (BMI >= 40) ([place]) 12/29/2011  . Knee pain 12/29/2011  . NEOPLASM, DIGESTIVE ORGANS,UNCERTAIN BEHAVIOR 78/67/6720  . ABDOMINAL  PAIN 06/25/2008  . DYSPHAGIA UNSPECIFIED 06/24/2008  . DYSPHAGIA 06/24/2008  . GERD 06/23/2008   Past Medical History:  Diagnosis Date  . Arthritis   . Depression   . GERD (gastroesophageal reflux disease)   . Hyperlipidemia    used to take lipitor  . Hypertension     Family History  Problem Relation Age of Onset  . Alzheimer's disease Mother   . Heart disease Mother   . Diabetes Mother   . Kidney disease Mother   . Hypertension Mother   . Stroke Mother   . Alcohol abuse Father   . Heart disease Father   . Hyperlipidemia Sister   . Hypertension Sister   . Hyperlipidemia Brother   . Hypertension Brother   . Cancer Paternal Aunt        ovarian cancer in her 6s    Past Surgical History:  Procedure Laterality  Date  . ANKLE FUSION Right 03/31/2015   Procedure: Right Talonavicular and Subtalar Fusion;  Surgeon: Newt Minion, MD;  Location: [place];  Service: Orthopedics;  Laterality: Right;  . CARPAL TUNNEL RELEASE     left hand  . DILATION AND CURETTAGE OF UTERUS     2015  . TUBAL LIGATION     1980s   Social History   Occupational History  . Not on file  Tobacco Use  . Smoking status: Never Smoker  Substance and Sexual Activity  . Alcohol use: No  . Drug use: No  . Sexual activity: Not on file

## 2020-02-18 ENCOUNTER — Ambulatory Visit: Payer: Self-pay

## 2020-02-18 ENCOUNTER — Ambulatory Visit (INDEPENDENT_AMBULATORY_CARE_PROVIDER_SITE_OTHER): Payer: Medicare Other

## 2020-02-18 ENCOUNTER — Encounter: Payer: Self-pay | Admitting: Family

## 2020-02-18 ENCOUNTER — Ambulatory Visit (INDEPENDENT_AMBULATORY_CARE_PROVIDER_SITE_OTHER): Payer: Medicare Other | Admitting: Family

## 2020-02-18 DIAGNOSIS — M79671 Pain in right foot: Secondary | ICD-10-CM | POA: Diagnosis not present

## 2020-02-18 NOTE — Progress Notes (Signed)
Subjective: She is here for right foot injection.  Pain for a month since turning while standing with her foot planted.  Pain is on the lateral aspect.  Objective: She is point tender at the peroneus brevis insertion on the proximal fifth metatarsal.  Surprisingly, no significant pain with eversion of the foot against resistance.  Limited diagnostic ultrasound: There is partial insertional tear of the peroneus brevis at the proximal fifth metatarsal.  Procedure: Ultrasound-guided right foot injection: After sterile prep with Betadine, injected 2 cc 1% lidocaine without epinephrine and 1 mg methylprednisolone just deep to the tendon insertion.  She had excellent relief during the anesthetic phase.  She will follow-up as directed.

## 2020-02-18 NOTE — Progress Notes (Signed)
Office Visit Note   Patient: Virginia Rios           Date of Birth: 12-19-1955           MRN: [id number] Visit Date: 02/18/2020              Requested by: No referring provider defined for this encounter. PCP: System, Provider Not In  No chief complaint on file.     HPI: The patient is a 64 year old woman seen today in follow-up for lateral ankle pain.  This is been ongoing for about a month now.  She initially did not have a specific injury when she recalls that today she states she was standing at the sink and pivoted and felt a twinge of pain in the lateral aspect of her ankle and foot.  She was able to continue ambulating but when she woke the next day her pain which is much worse.  She was initially evaluated about 3 weeks ago in our office.  Placed in an ASO for ankle stability.  Presents today reports his joint doing no better.  Continues with lateral ankle and foot pain.  She states she is unable to walk without the ASO but continues having pain some pain beneath the lateral column  Worse pain with ambulation.  About 5 years out from a subtalar and talonavicular fusion  Assessment & Plan: Visit Diagnoses:  1. Pain in right foot     Plan: She will continue with the ASO for the next several weeks we will asked Dr. Junius Roads to trial a peroneal tendon injection and follow-up in the office with Dr. Sharol Given as needed  Follow-Up Instructions: Return in about 22 days (around 03/11/2020).   Right Ankle Exam   Tenderness  The patient is experiencing tenderness in the ATF. Swelling: mild  Range of Motion  The patient has normal right ankle ROM.  Muscle Strength  The patient has normal right ankle strength.  Other  Pulse: present   Comments:  Tender along insertion of peroneal tendon.  Very mild pain with resisted eversion. inversion intact.      Patient is alert, oriented, no adenopathy, well-dressed, normal affect, normal respiratory effort.   Imaging: US Guided  Needle Placement  Result Date: 02/18/2020 Ultrasound-guided right foot injection: After sterile prep with Betadine, injected 2 cc 1% lidocaine without epinephrine and 1 mg methylprednisolone just deep to the tendon insertion.  She had excellent relief during the anesthetic phase.  She will follow-up as directed.  XR Foot Complete Right  Result Date: 02/18/2020 Radiographs of right foot negative for fracture.   No images are attached to the encounter.  Labs: No results found for: HGBA1C, ESRSEDRATE, CRP, LABURIC, REPTSTATUS, GRAMSTAIN, CULT, LABORGA   Lab Results  Component Value Date   ALBUMIN 3.8 03/23/2015    No results found for: MG No results found for: VD25OH  No results found for: PREALBUMIN CBC EXTENDED Latest Ref Rng & Units 12/29/2011  WBC 4.0 - 10.5 K/uL 10.8(H)  RBC 3.87 - 5.11 MIL/uL 5.27(H)  HGB 12.0 - 15.0 g/dL 12.4  HCT 36 - 46 % 41.8  PLT 150 - 400 K/uL 241  NEUTROABS 1.7 - 7.7 K/uL 6.5  LYMPHSABS 0.7 - 4.0 K/uL 3.3     There is no height or weight on file to calculate BMI.  Orders:  Orders Placed This Encounter  Procedures  . XR Foot Complete Right  . US Guided Needle Placement   No orders of the defined  types were placed in this encounter.    Procedures: No procedures performed  Clinical Data: No additional findings.  ROS:  All other systems negative, except as noted in the HPI. Review of Systems  Constitutional: Negative for chills and fever.  Musculoskeletal: Positive for gait problem, joint swelling and myalgias.    Objective: Vital Signs: There were no vitals taken for this visit.  Specialty Comments:  No specialty comments available.  PMFS History: Patient Active Problem List   Diagnosis Date Noted  . 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 Hypertension 12/29/2011  . Severe obesity (BMI >= 40) ([place]) 12/29/2011  . Knee pain 12/29/2011  . NEOPLASM, DIGESTIVE ORGANS,UNCERTAIN BEHAVIOR 85/27/7824  . ABDOMINAL  PAIN 06/25/2008  . DYSPHAGIA UNSPECIFIED 06/24/2008  . DYSPHAGIA 06/24/2008  . GERD 06/23/2008   Past Medical History:  Diagnosis Date  . Arthritis   . Depression   . GERD (gastroesophageal reflux disease)   . Hyperlipidemia    used to take lipitor  . Hypertension     Family History  Problem Relation Age of Onset  . Alzheimer's disease Mother   . Heart disease Mother   . Diabetes Mother   . Kidney disease Mother   . Hypertension Mother   . Stroke Mother   . Alcohol abuse Father   . Heart disease Father   . Hyperlipidemia Sister   . Hypertension Sister   . Hyperlipidemia Brother   . Hypertension Brother   . Cancer Paternal Aunt        ovarian cancer in her 74s    Past Surgical History:  Procedure Laterality Date  . ANKLE FUSION Right 03/31/2015   Procedure: Right Talonavicular and Subtalar Fusion;  Surgeon: Newt Minion, MD;  Location: [place];  Service: Orthopedics;  Laterality: Right;  . CARPAL TUNNEL RELEASE     left hand  . DILATION AND CURETTAGE OF UTERUS     2015  . TUBAL LIGATION     1980s   Social History   Occupational History  . Not on file  Tobacco Use  . Smoking status: Never Smoker  Substance and Sexual Activity  . Alcohol use: No  . Drug use: No  . Sexual activity: Not on file

## 2020-02-20 ENCOUNTER — Telehealth: Payer: Self-pay | Admitting: Orthopedic Surgery

## 2020-02-20 NOTE — Telephone Encounter (Signed)
If she wants a boot that's fine. Just let her know its a lot heavier than the brace

## 2020-02-20 NOTE — Telephone Encounter (Signed)
Patient called requesting a boot for foot. Patient stated to need the boot more than the brace. Please call patient about this matter. Patient phone number is [phone].

## 2020-02-20 NOTE — Telephone Encounter (Signed)
I s/w patient and advised. She would like to try short fracture boot. Patient will come in on Monday for this.

## 2020-02-20 NOTE — Telephone Encounter (Signed)
Is this something you agree with?

## 2020-02-23 ENCOUNTER — Ambulatory Visit: Payer: Medicare Other

## 2020-11-29 ENCOUNTER — Ambulatory Visit (INDEPENDENT_AMBULATORY_CARE_PROVIDER_SITE_OTHER): Payer: Medicare Other | Admitting: Physician Assistant

## 2020-11-29 ENCOUNTER — Other Ambulatory Visit: Payer: Self-pay

## 2020-11-29 ENCOUNTER — Encounter: Payer: Self-pay | Admitting: Orthopedic Surgery

## 2020-11-29 DIAGNOSIS — G8929 Other chronic pain: Secondary | ICD-10-CM | POA: Diagnosis not present

## 2020-11-29 DIAGNOSIS — M25561 Pain in right knee: Secondary | ICD-10-CM | POA: Diagnosis not present

## 2020-11-29 DIAGNOSIS — M25562 Pain in left knee: Secondary | ICD-10-CM | POA: Diagnosis not present

## 2020-11-29 NOTE — Progress Notes (Signed)
Office Visit Note   Patient: Virginia Rios           Date of Birth: Oct 27, 1955           MRN: [id number] Visit Date: 11/29/2020              Requested by: No referring provider defined for this encounter. PCP: System, Provider Not In  Chief Complaint  Patient presents with   Right Knee - Follow-up    [facility] for a total knee 12/08/2020      HPI: Patient presents today for discussion regarding her upcoming right knee replacement.  She has failed conservative treatment and would like to discuss procedure and questions today.  Assessment & Plan: Visit Diagnoses: No diagnosis found.  Plan: We will go forward with her knee replacement.  All her concerns and questions were answered.  Questions regarding length of stay in the hospital risks including bleeding infection anesthesia complications were discussed.  Recovery was discussed.  Back to regular activities was discussed.  Follow-Up Instructions: No follow-ups on file.   Ortho Exam  Patient is alert, oriented, no adenopathy, well-dressed, normal affect, normal respiratory effort. Right knee no effusion no cellulitis or signs of infection she does have pain and grinding with range of motion heart RRR Lungs Clear  Imaging: No results found. No images are attached to the encounter.  Labs: No results found for: HGBA1C, ESRSEDRATE, CRP, LABURIC, REPTSTATUS, GRAMSTAIN, CULT, LABORGA   Lab Results  Component Value Date   ALBUMIN 3.8 03/23/2015    No results found for: MG No results found for: VD25OH  No results found for: PREALBUMIN CBC EXTENDED Latest Ref Rng & Units 12/29/2011  WBC 4.0 - 10.5 K/uL 10.8(H)  RBC 3.87 - 5.11 MIL/uL 5.27(H)  HGB 12.0 - 15.0 g/dL 12.4  HCT 36.0 - 46.0 % 41.8  PLT 150 - 400 K/uL 241  NEUTROABS 1.7 - 7.7 K/uL 6.5  LYMPHSABS 0.7 - 4.0 K/uL 3.3     There is no height or weight on file to calculate BMI.  Orders:  No orders of the defined types were placed in this encounter.  No orders of  the defined types were placed in this encounter.    Procedures: No procedures performed  Clinical Data: No additional findings.  ROS:  All other systems negative, except as noted in the HPI. Review of Systems  Objective: Vital Signs: There were no vitals taken for this visit.  Specialty Comments:  No specialty comments available.  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   Hypertension 12/29/2011   Severe obesity (BMI >= 40) ([facility]) 12/29/2011   Knee pain 12/29/2011   NEOPLASM, DIGESTIVE ORGANS,UNCERTAIN BEHAVIOR 78/46/9629   ABDOMINAL PAIN 06/25/2008   DYSPHAGIA UNSPECIFIED 06/24/2008   DYSPHAGIA 06/24/2008   GERD 06/23/2008   Past Medical History:  Diagnosis Date   Arthritis    Depression    GERD (gastroesophageal reflux disease)    Hyperlipidemia    used to take lipitor   Hypertension     Family History  Problem Relation Age of Onset   Alzheimer's disease Mother    Heart disease Mother    Diabetes Mother    Kidney disease Mother    Hypertension Mother    Stroke Mother    Alcohol abuse Father    Heart disease Father    Hyperlipidemia Sister    Hypertension Sister    Hyperlipidemia Brother  Hypertension Brother    Cancer Paternal Aunt        ovarian cancer in her 21s    Past Surgical History:  Procedure Laterality Date   ANKLE FUSION Right 03/31/2015   Procedure: Right Talonavicular and Subtalar Fusion;  Surgeon: Newt Minion, MD;  Location: [place];  Service: Orthopedics;  Laterality: Right;   CARPAL TUNNEL RELEASE     left hand   DILATION AND CURETTAGE OF UTERUS     2015   TUBAL LIGATION     1980s   Social History   Occupational History   Not on file  Tobacco Use   Smoking status: Never   Smokeless tobacco: Not on file  Substance and Sexual Activity   Alcohol use: No   Drug use: No   Sexual activity: Not on file

## 2020-11-30 ENCOUNTER — Other Ambulatory Visit: Payer: Self-pay | Admitting: Physician Assistant

## 2020-12-02 ENCOUNTER — Ambulatory Visit: Payer: Self-pay | Admitting: Sports Medicine

## 2020-12-03 NOTE — Progress Notes (Signed)
Surgical Instructions    Your procedure is scheduled on Wednesday June 29th.  Report to [facility] Main Entrance "A" at 05:30 A.M., then check in with the Admitting office.  Call this number if you have problems the morning of surgery:  [phone]   If you have any questions prior to your surgery date call [phone]: Open Monday-Friday 8am-4pm    Remember:  Do not eat after midnight the night before your surgery  You may drink clear liquids until 04:30 A.M. the morning of your surgery.   Clear liquids allowed are: Water, Non-Citrus Juices (without pulp), Carbonated Beverages, Clear Tea, Black Coffee Only, and Gatorade  Patient Instructions  The night before surgery:  No food after midnight. ONLY clear liquids after midnight  The day of surgery (if you have diabetes): Drink ONE (1) 12 oz G2 given to you in your pre admission testing appointment by 04:30 A.M. the morning of surgery. Drink in one sitting. Do not sip.  This drink was given to you during your hospital  pre-op appointment visit.  Nothing else to drink after completing the  12 oz bottle of G2.         If you have questions, please contact your surgeon's office.     Take these medicines the morning of surgery with A SIP OF WATER   pantoprazole (PROTONIX)  simvastatin (ZOCOR)  sertraline (ZOLOFT)   albuterol (PROVENTIL HFA;VENTOLIN HFA)- If needed  HYDROcodone-homatropine (HYCODAN)- If needed  oxyCODONE-acetaminophen (ROXICET)- If needed        As of today, STOP taking any Aspirin (unless otherwise instructed by your surgeon) Aleve, Naproxen, Ibuprofen, Motrin, Advil, Goody's, BC's, all herbal medications, fish oil, and all vitamins.                     Do NOT Smoke (Tobacco/Vaping) or drink Alcohol 24 hours prior to your procedure.  If you use a CPAP at night, you may bring all equipment for your overnight stay.   Contacts, glasses, piercing's, hearing aid's, dentures or partials may not be worn into  surgery, please bring cases for these belongings.    For patients admitted to the hospital, discharge time will be determined by your treatment team.   Patients discharged the day of surgery will not be allowed to drive home, and someone needs to stay with them for 24 hours.  ONLY 1 SUPPORT PERSON MAY BE PRESENT WHILE YOU ARE IN SURGERY. IF YOU ARE TO BE ADMITTED ONCE YOU ARE IN YOUR ROOM YOU WILL BE ALLOWED TWO (2) VISITORS.  Minor children may have two parents present. Special consideration for safety and communication needs will be reviewed on a case by case basis.   Special instructions:   [facility]- Preparing For Surgery  Before surgery, you can play an important role. Because skin is not sterile, your skin needs to be as free of germs as possible. You can reduce the number of germs on your skin by washing with CHG (chlorahexidine gluconate) Soap before surgery.  CHG is an antiseptic cleaner which kills germs and bonds with the skin to continue killing germs even after washing.    Oral Hygiene is also important to reduce your risk of infection.  Remember - BRUSH YOUR TEETH THE MORNING OF SURGERY WITH YOUR REGULAR TOOTHPASTE  Please do not use if you have an allergy to CHG or antibacterial soaps. If your skin becomes reddened/irritated stop using the CHG.  Do not shave (including legs and underarms) for  at least 48 hours prior to first CHG shower. It is OK to shave your face.  Please follow these instructions carefully.   Shower the NIGHT BEFORE SURGERY and the MORNING OF SURGERY  If you chose to wash your hair, wash your hair first as usual with your normal shampoo.  After you shampoo, rinse your hair and body thoroughly to remove the shampoo.  Use CHG Soap as you would any other liquid soap. You can apply CHG directly to the skin and wash gently with a scrungie or a clean washcloth.   Apply the CHG Soap to your body ONLY FROM THE NECK DOWN.  Do not use on open wounds or open  sores. Avoid contact with your eyes, ears, mouth and genitals (private parts). Wash Face and genitals (private parts)  with your normal soap.   Wash thoroughly, paying special attention to the area where your surgery will be performed.  Thoroughly rinse your body with warm water from the neck down.  DO NOT shower/wash with your normal soap after using and rinsing off the CHG Soap.  Pat yourself dry with a CLEAN TOWEL.  Wear CLEAN PAJAMAS to bed the night before surgery  Place CLEAN SHEETS on your bed the night before your surgery  DO NOT SLEEP WITH PETS.   Day of Surgery: Shower with CHG soap. Do not wear jewelry, make up, nail polish, gel polish, artificial nails, or any other type of covering on natural nails including finger and toenails. If patients have artificial nails, gel coating, etc. that need to be removed by a nail salon please have this removed prior to surgery. Surgery may need to be canceled/delayed if the surgeon/ anesthesia feels like the patient is unable to be adequately monitored. Do not wear lotions, powders, perfumes/colognes, or deodorant. Do not shave 48 hours prior to surgery.  Men may shave face and neck. Do not bring valuables to the hospital. [facility] is not responsible for any belongings or valuables. Wear Clean/Comfortable clothing the morning of surgery Remember to brush your teeth WITH YOUR REGULAR TOOTHPASTE.   Please read over the following fact sheets that you were given.

## 2020-12-06 ENCOUNTER — Telehealth: Payer: Self-pay | Admitting: Orthopedic Surgery

## 2020-12-06 ENCOUNTER — Encounter (HOSPITAL_COMMUNITY)
Admission: RE | Admit: 2020-12-06 | Discharge: 2020-12-06 | Disposition: A | Discharge status: Home / Self Care | Payer: Medicare Other | Source: Ambulatory Visit | Attending: Orthopedic Surgery | Admitting: Orthopedic Surgery

## 2020-12-06 ENCOUNTER — Encounter (HOSPITAL_COMMUNITY): Payer: Self-pay

## 2020-12-06 ENCOUNTER — Other Ambulatory Visit: Payer: Self-pay

## 2020-12-06 DIAGNOSIS — Z20822 Contact with and (suspected) exposure to covid-19: Secondary | ICD-10-CM | POA: Insufficient documentation

## 2020-12-06 DIAGNOSIS — E119 Type 2 diabetes mellitus without complications: Secondary | ICD-10-CM | POA: Insufficient documentation

## 2020-12-06 DIAGNOSIS — I10 Essential (primary) hypertension: Secondary | ICD-10-CM | POA: Insufficient documentation

## 2020-12-06 DIAGNOSIS — Z6841 Body Mass Index (BMI) 40.0 and over, adult: Secondary | ICD-10-CM | POA: Insufficient documentation

## 2020-12-06 DIAGNOSIS — K219 Gastro-esophageal reflux disease without esophagitis: Secondary | ICD-10-CM | POA: Insufficient documentation

## 2020-12-06 DIAGNOSIS — Z01818 Encounter for other preprocedural examination: Secondary | ICD-10-CM | POA: Insufficient documentation

## 2020-12-06 DIAGNOSIS — Z7901 Long term (current) use of anticoagulants: Secondary | ICD-10-CM | POA: Insufficient documentation

## 2020-12-06 DIAGNOSIS — Z79899 Other long term (current) drug therapy: Secondary | ICD-10-CM | POA: Insufficient documentation

## 2020-12-06 DIAGNOSIS — M1711 Unilateral primary osteoarthritis, right knee: Secondary | ICD-10-CM | POA: Insufficient documentation

## 2020-12-06 HISTORY — DX: Other specified postprocedural states: Z98.890

## 2020-12-06 HISTORY — DX: Type 2 diabetes mellitus without complications: E11.9

## 2020-12-06 HISTORY — DX: Other specified postprocedural states: R11.2

## 2020-12-06 LAB — GLUCOSE, CAPILLARY: Glucose-Capillary: 116 mg/dL — ABNORMAL HIGH (ref 70–99)

## 2020-12-06 LAB — CBC
HCT: 39.9 % (ref 36.0–46.0)
Hemoglobin: 11.7 g/dL — ABNORMAL LOW (ref 12.0–15.0)
MCH: 23.1 pg — ABNORMAL LOW (ref 26.0–34.0)
MCHC: 29.3 g/dL — ABNORMAL LOW (ref 30.0–36.0)
MCV: 78.9 fL — ABNORMAL LOW (ref 80.0–100.0)
Platelets: 290 10*3/uL (ref 150–400)
RBC: 5.06 MIL/uL (ref 3.87–5.11)
RDW: 14.6 % (ref 11.5–15.5)
WBC: 8.5 10*3/uL (ref 4.0–10.5)
nRBC: 0 % (ref 0.0–0.2)

## 2020-12-06 LAB — SURGICAL PCR SCREEN
MRSA, PCR: NEGATIVE
Staphylococcus aureus: NEGATIVE

## 2020-12-06 LAB — SARS CORONAVIRUS 2 (TAT 6-24 HRS): SARS Coronavirus 2: NEGATIVE

## 2020-12-06 NOTE — Telephone Encounter (Signed)
I have no clue, she said it was preop survey, so I'm not sure if it came from a link or it was an email. I was hoping cheryl might've been familiar with the process.

## 2020-12-06 NOTE — Progress Notes (Addendum)
PCP - Annetta Maw, MD - [facility] Cardiologist - denies  PPM/ICD - denies Device Orders - N/A Rep Notified - N/A  Chest x-ray - N/A EKG - 12/06/2020 Stress Test - denies ECHO - denies Cardiac Cath - denies  Sleep Study - denies CPAP - N/A  CBG 12/06/2020 - 116 Fasting Blood Sugar - 99-136 Checks Blood Sugar 2-3 times a week A1C - 11/19/2020 - 6.1  Blood Thinner Instructions: N/A Aspirin Instructions: Patient was instructed: As of today, STOP taking any Aspirin (unless otherwise instructed by your surgeon) Aleve, Naproxen, Ibuprofen, Motrin, Advil, Goody's, BC's, all herbal medications, fish oil, and all vitamins, Mobic.  Patient verbalized that she is taking Aspirin  ERAS Protcol - yes PRE-SURGERY G - yes  COVID TEST- 12/06/2020   Anesthesia review: yes; nausea after anesthesia  Patient denies shortness of breath, fever, cough and chest pain at PAT appointment   All instructions explained to the patient, with a verbal understanding of the material. Patient agrees to go over the instructions while at home for a better understanding. Patient also instructed to self quarantine after being tested for COVID-19. The opportunity to ask questions was provided.

## 2020-12-06 NOTE — Telephone Encounter (Signed)
Do you know how to do this? I do not

## 2020-12-06 NOTE — Telephone Encounter (Signed)
Pt came in stating she has surgery on 12/08/20 and she was doing the survey that was sent to her and it froze midway and wouldn't let her finish. She would like to have another link sent so she can try to finish the survey; and she would like a CB when the link has been sent to she can do it right away.  [phone]

## 2020-12-07 NOTE — Anesthesia Preprocedure Evaluation (Addendum)
Anesthesia Evaluation  Patient identified by MRN, date of birth, ID band Patient awake    Reviewed: Allergy & Precautions, NPO status , Patient's Chart, lab work & pertinent test results  History of Anesthesia Complications (+) PONV  Airway Mallampati: III  TM Distance: >3 FB Neck ROM: Full    Dental no notable dental hx. (+) Teeth Intact, Dental Advisory Given   Pulmonary neg pulmonary ROS,    Pulmonary exam normal breath sounds clear to auscultation       Cardiovascular hypertension, Pt. on medications Normal cardiovascular exam Rhythm:Regular Rate:Normal     Neuro/Psych PSYCHIATRIC DISORDERS Depression negative neurological ROS     GI/Hepatic Neg liver ROS, GERD  Controlled and Medicated,  Endo/Other  diabetesMorbid obesity (BMI 46)  Renal/GU negative Renal ROS  negative genitourinary   Musculoskeletal  (+) Arthritis ,   Abdominal   Peds  Hematology negative hematology ROS (+)   Anesthesia Other Findings   Reproductive/Obstetrics                           Anesthesia Physical Anesthesia Plan  ASA: 3  Anesthesia Plan: Spinal and Regional   Post-op Pain Management:  Regional for Post-op pain   Induction:   PONV Risk Score and Plan: 3 and Treatment may vary due to age or medical condition, Midazolam, Ondansetron, Dexamethasone and Propofol infusion  Airway Management Planned: Natural Airway  Additional Equipment:   Intra-op Plan:   Post-operative Plan:   Informed Consent: I have reviewed the patients History and Physical, chart, labs and discussed the procedure including the risks, benefits and alternatives for the proposed anesthesia with the patient or authorized representative who has indicated his/her understanding and acceptance.     Dental advisory given  Plan Discussed with: CRNA  Anesthesia Plan Comments: (PAT note written 12/07/2020 by Myra Gianotti, PA-C. )        Anesthesia Quick Evaluation

## 2020-12-07 NOTE — Progress Notes (Signed)
Anesthesia Chart Review:  Case: [id number] Date/Time: 12/08/20 0715   Procedure: RIGHT TOTAL KNEE ARTHROPLASTY (Right: Knee)   Anesthesia type: Choice   Pre-op diagnosis: Osteoarthritis Right Knee   Location: [facility] OR ROOM 03 / [place] OR   Surgeons: Newt Minion, MD       DISCUSSION: Patient is a 65 year old female scheduled for the above procedure.  History includes never smoker, post-operative N/V, GERD, HLD, DM2, HTN, right ankle fusion (03/31/15). BMI is consistent with morbid obesity.   Last PCP visit with Dr. Ruben Gottron 11/19/20. A1c 6.1%. HTN overall controlled.   12/06/2020 presurgical COVID-19 test negative.  Anesthesia team to evaluate on the day of surgery.  VS: BP 132/60   Pulse 89   Temp 36.8 C   Resp 18   Ht 5' (1.524 m)   Wt 107.8 kg   SpO2 96%   BMI 46.40 kg/m   PROVIDERS: Rolan Lipa, MD is PCP ([facility] Everywhere)   LABS: PAT and PCP labs from 11/19/20 reviewed.  (all labs ordered are listed, but only abnormal results are displayed)  Labs Reviewed  GLUCOSE, CAPILLARY - Abnormal; Notable for the following components:      Result Value   Glucose-Capillary 116 (*)    All other components within normal limits  CBC - Abnormal; Notable for the following components:   Hemoglobin 11.7 (*)    MCV 78.9 (*)    MCH 23.1 (*)    MCHC 29.3 (*)    All other components within normal limits  SURGICAL PCR SCREEN  SARS CORONAVIRUS 2 (TAT 6-24 HRS)   She had a CMET and A1c done through PCP ([facility]) on 11/19/20 and showed an A1c of 6.1% and a normal CMET except CO2 31 (normal 23-30). Creatinine 0.87.   IMAGES: Xray L-spine 05/24/20 ([facility]): Impression: 1.  No acute fracture or malalignment.  2.  Multilevel mild degenerative disc disease.  3.  Moderate lower lumbar facet arthropathy.  4.  Moderate degenerative changes of bilateral sacroiliac joints.  5.  Bilateral tubal ligation clips.   EKG: 12/06/20: NSR   CV: N/A  Past Medical History:  Diagnosis  Date   Arthritis    Depression    Diabetes mellitus without complication (HCC)    GERD (gastroesophageal reflux disease)    Hyperlipidemia    used to take lipitor   Hypertension    PONV (postoperative nausea and vomiting)     Past Surgical History:  Procedure Laterality Date   ANKLE FUSION Right 03/31/2015   Procedure: Right Talonavicular and Subtalar Fusion;  Surgeon: Newt Minion, MD;  Location: [place];  Service: Orthopedics;  Laterality: Right;   CARPAL TUNNEL RELEASE     left hand   DILATION AND CURETTAGE OF UTERUS     2015   TUBAL LIGATION     1980s    MEDICATIONS:  acetaminophen (TYLENOL) 650 MG CR tablet   albuterol (PROVENTIL HFA;VENTOLIN HFA) 108 (90 BASE) MCG/ACT inhaler   lisinopril-hydrochlorothiazide (PRINZIDE,ZESTORETIC) 20-12.5 MG tablet   pantoprazole (PROTONIX) 40 MG tablet   Polyethyl Glycol-Propyl Glycol (SYSTANE) 0.4-0.3 % SOLN   sertraline (ZOLOFT) 50 MG tablet   simvastatin (ZOCOR) 40 MG tablet   No current facility-administered medications for this encounter.    Myra Gianotti, PA-C Surgical Short Stay/Anesthesiology [facility] Phone [phone] [facility] Phone [phone] 12/07/2020 12:59 PM

## 2020-12-08 ENCOUNTER — Ambulatory Visit (HOSPITAL_COMMUNITY): Payer: Medicare Other | Admitting: Vascular Surgery

## 2020-12-08 ENCOUNTER — Encounter (HOSPITAL_COMMUNITY): Payer: Self-pay | Admitting: Orthopedic Surgery

## 2020-12-08 ENCOUNTER — Inpatient Hospital Stay (HOSPITAL_COMMUNITY)
Admission: RE | Admit: 2020-12-08 | Discharge: 2020-12-10 | DRG: 470 | Disposition: A | Discharge status: Home Health | Payer: Medicare Other | Source: Ambulatory Visit | Attending: Orthopedic Surgery | Admitting: Orthopedic Surgery

## 2020-12-08 ENCOUNTER — Ambulatory Visit (HOSPITAL_COMMUNITY): Payer: Medicare Other | Admitting: Certified Registered Nurse Anesthetist

## 2020-12-08 ENCOUNTER — Encounter (HOSPITAL_COMMUNITY)
Admission: RE | Disposition: A | Discharge status: Home / Self Care | Payer: Self-pay | Source: Ambulatory Visit | Attending: Orthopedic Surgery

## 2020-12-08 DIAGNOSIS — Z83438 Family history of other disorder of lipoprotein metabolism and other lipidemia: Secondary | ICD-10-CM

## 2020-12-08 DIAGNOSIS — Z20822 Contact with and (suspected) exposure to covid-19: Secondary | ICD-10-CM | POA: Diagnosis present

## 2020-12-08 DIAGNOSIS — Z6841 Body Mass Index (BMI) 40.0 and over, adult: Secondary | ICD-10-CM

## 2020-12-08 DIAGNOSIS — M1711 Unilateral primary osteoarthritis, right knee: Secondary | ICD-10-CM | POA: Diagnosis not present

## 2020-12-08 DIAGNOSIS — Z841 Family history of disorders of kidney and ureter: Secondary | ICD-10-CM

## 2020-12-08 DIAGNOSIS — E119 Type 2 diabetes mellitus without complications: Secondary | ICD-10-CM | POA: Diagnosis present

## 2020-12-08 DIAGNOSIS — Z823 Family history of stroke: Secondary | ICD-10-CM

## 2020-12-08 DIAGNOSIS — I10 Essential (primary) hypertension: Secondary | ICD-10-CM | POA: Diagnosis present

## 2020-12-08 DIAGNOSIS — Z833 Family history of diabetes mellitus: Secondary | ICD-10-CM

## 2020-12-08 DIAGNOSIS — Z96651 Presence of right artificial knee joint: Secondary | ICD-10-CM

## 2020-12-08 DIAGNOSIS — Z981 Arthrodesis status: Secondary | ICD-10-CM

## 2020-12-08 DIAGNOSIS — Z8249 Family history of ischemic heart disease and other diseases of the circulatory system: Secondary | ICD-10-CM

## 2020-12-08 HISTORY — PX: TOTAL KNEE ARTHROPLASTY: SHX125

## 2020-12-08 LAB — GLUCOSE, CAPILLARY
Glucose-Capillary: 106 mg/dL — ABNORMAL HIGH (ref 70–99)
Glucose-Capillary: 115 mg/dL — ABNORMAL HIGH (ref 70–99)

## 2020-12-08 SURGERY — ARTHROPLASTY, KNEE, TOTAL
Anesthesia: Regional | Site: Knee | Laterality: Right

## 2020-12-08 MED ORDER — ROPIVACAINE HCL 5 MG/ML IJ SOLN
INTRAMUSCULAR | Status: DC | PRN
  Administered 2020-12-08: 20 mL via PERINEURAL

## 2020-12-08 MED ORDER — PROPOFOL 1000 MG/100ML IV EMUL
INTRAVENOUS | Status: AC
  Filled 2020-12-08: qty 100

## 2020-12-08 MED ORDER — KETOROLAC TROMETHAMINE 15 MG/ML IJ SOLN
INTRAMUSCULAR | Status: AC
  Filled 2020-12-08: qty 1

## 2020-12-08 MED ORDER — DEXAMETHASONE SODIUM PHOSPHATE 10 MG/ML IJ SOLN
INTRAMUSCULAR | Status: DC | PRN
  Administered 2020-12-08: 5 mg

## 2020-12-08 MED ORDER — ALBUTEROL SULFATE HFA 108 (90 BASE) MCG/ACT IN AERS
2.0000 | INHALATION_SPRAY | RESPIRATORY_TRACT | Status: DC | PRN
  Filled 2020-12-08: qty 6.7

## 2020-12-08 MED ORDER — MIDAZOLAM HCL 5 MG/5ML IJ SOLN
INTRAMUSCULAR | Status: DC | PRN
  Administered 2020-12-08: 2 mg via INTRAVENOUS

## 2020-12-08 MED ORDER — PROPOFOL 10 MG/ML IV BOLUS
INTRAVENOUS | Status: DC | PRN
  Administered 2020-12-08: 50 ug/kg/min via INTRAVENOUS
  Administered 2020-12-08 (×2): 20 mg via INTRAVENOUS

## 2020-12-08 MED ORDER — ORAL CARE MOUTH RINSE: 15.0000 mL | Freq: Once | OROMUCOSAL | Status: AC

## 2020-12-08 MED ORDER — ONDANSETRON HCL 4 MG PO TABS: 4.0000 mg | ORAL_TABLET | Freq: Four times a day (QID) | ORAL | Status: DC | PRN

## 2020-12-08 MED ORDER — CEFAZOLIN SODIUM-DEXTROSE 2-4 GM/100ML-% IV SOLN
2.0000 g | INTRAVENOUS | Status: AC
  Administered 2020-12-08: 2 g via INTRAVENOUS
  Filled 2020-12-08: qty 100

## 2020-12-08 MED ORDER — TRANEXAMIC ACID-NACL 1000-0.7 MG/100ML-% IV SOLN
1000.0000 mg | Freq: Once | INTRAVENOUS | Status: AC
  Administered 2020-12-08: 1000 mg via INTRAVENOUS
  Filled 2020-12-08: qty 100

## 2020-12-08 MED ORDER — SODIUM CHLORIDE 0.9 % IV SOLN: INTRAVENOUS | Status: DC

## 2020-12-08 MED ORDER — CEFAZOLIN SODIUM-DEXTROSE 2-4 GM/100ML-% IV SOLN
2.0000 g | Freq: Four times a day (QID) | INTRAVENOUS | Status: AC
  Administered 2020-12-08 (×2): 2 g via INTRAVENOUS
  Filled 2020-12-08 (×2): qty 100

## 2020-12-08 MED ORDER — ACETAMINOPHEN 325 MG PO TABS
325.0000 mg | ORAL_TABLET | Freq: Four times a day (QID) | ORAL | Status: DC | PRN
  Administered 2020-12-09: 650 mg via ORAL
  Filled 2020-12-08: qty 2

## 2020-12-08 MED ORDER — FENTANYL CITRATE (PF) 100 MCG/2ML IJ SOLN: 25.0000 ug | INTRAMUSCULAR | Status: DC | PRN

## 2020-12-08 MED ORDER — SERTRALINE HCL 50 MG PO TABS
50.0000 mg | ORAL_TABLET | Freq: Every day | ORAL | Status: DC
  Administered 2020-12-09 – 2020-12-10 (×2): 50 mg via ORAL
  Filled 2020-12-08 (×2): qty 1

## 2020-12-08 MED ORDER — ACETAMINOPHEN 500 MG PO TABS
1000.0000 mg | ORAL_TABLET | Freq: Once | ORAL | Status: AC
  Administered 2020-12-08: 1000 mg via ORAL
  Filled 2020-12-08: qty 2

## 2020-12-08 MED ORDER — LISINOPRIL 20 MG PO TABS
20.0000 mg | ORAL_TABLET | Freq: Every day | ORAL | Status: DC
  Administered 2020-12-09: 20 mg via ORAL
  Filled 2020-12-08 (×3): qty 1

## 2020-12-08 MED ORDER — MIDAZOLAM HCL 2 MG/2ML IJ SOLN
INTRAMUSCULAR | Status: AC
  Filled 2020-12-08: qty 2

## 2020-12-08 MED ORDER — LACTATED RINGERS IV SOLN: INTRAVENOUS | Status: DC

## 2020-12-08 MED ORDER — PHENYLEPHRINE HCL-NACL 10-0.9 MG/250ML-% IV SOLN
INTRAVENOUS | Status: DC | PRN
  Administered 2020-12-08: 20 ug/min via INTRAVENOUS

## 2020-12-08 MED ORDER — DEXAMETHASONE SODIUM PHOSPHATE 10 MG/ML IJ SOLN
INTRAMUSCULAR | Status: AC
  Filled 2020-12-08: qty 1

## 2020-12-08 MED ORDER — CHLORHEXIDINE GLUCONATE 0.12 % MT SOLN
15.0000 mL | Freq: Once | OROMUCOSAL | Status: AC
  Administered 2020-12-08: 15 mL via OROMUCOSAL
  Filled 2020-12-08: qty 15

## 2020-12-08 MED ORDER — POLYETHYL GLYCOL-PROPYL GLYCOL 0.4-0.3 % OP SOLN: 1.0000 [drp] | Freq: Every day | OPHTHALMIC | Status: DC | PRN

## 2020-12-08 MED ORDER — PHENOL 1.4 % MT LIQD: 1.0000 | OROMUCOSAL | Status: DC | PRN

## 2020-12-08 MED ORDER — METOCLOPRAMIDE HCL 5 MG PO TABS: 5.0000 mg | ORAL_TABLET | Freq: Three times a day (TID) | ORAL | Status: DC | PRN

## 2020-12-08 MED ORDER — METOCLOPRAMIDE HCL 5 MG/ML IJ SOLN: 5.0000 mg | Freq: Three times a day (TID) | INTRAMUSCULAR | Status: DC | PRN

## 2020-12-08 MED ORDER — TRANEXAMIC ACID 1000 MG/10ML IV SOLN
2000.0000 mg | INTRAVENOUS | Status: DC
  Filled 2020-12-08: qty 20

## 2020-12-08 MED ORDER — ASPIRIN EC 325 MG PO TBEC
325.0000 mg | DELAYED_RELEASE_TABLET | Freq: Every day | ORAL | Status: DC
  Administered 2020-12-09 – 2020-12-10 (×2): 325 mg via ORAL
  Filled 2020-12-08 (×2): qty 1

## 2020-12-08 MED ORDER — OXYCODONE HCL 5 MG PO TABS
ORAL_TABLET | ORAL | Status: AC
  Filled 2020-12-08: qty 2

## 2020-12-08 MED ORDER — DOCUSATE SODIUM 100 MG PO CAPS
100.0000 mg | ORAL_CAPSULE | Freq: Two times a day (BID) | ORAL | Status: DC
  Administered 2020-12-08 – 2020-12-10 (×4): 100 mg via ORAL
  Filled 2020-12-08 (×4): qty 1

## 2020-12-08 MED ORDER — OXYCODONE HCL 5 MG PO TABS
5.0000 mg | ORAL_TABLET | ORAL | Status: DC | PRN
  Administered 2020-12-08 – 2020-12-10 (×7): 10 mg via ORAL
  Filled 2020-12-08 (×7): qty 2

## 2020-12-08 MED ORDER — SIMVASTATIN 20 MG PO TABS
40.0000 mg | ORAL_TABLET | Freq: Every day | ORAL | Status: DC
  Administered 2020-12-08 – 2020-12-09 (×2): 40 mg via ORAL
  Filled 2020-12-08 (×2): qty 2

## 2020-12-08 MED ORDER — FENTANYL CITRATE (PF) 250 MCG/5ML IJ SOLN
INTRAMUSCULAR | Status: AC
  Filled 2020-12-08: qty 5

## 2020-12-08 MED ORDER — MENTHOL 3 MG MT LOZG: 1.0000 | LOZENGE | OROMUCOSAL | Status: DC | PRN

## 2020-12-08 MED ORDER — TRANEXAMIC ACID 1000 MG/10ML IV SOLN
INTRAVENOUS | Status: DC | PRN
  Administered 2020-12-08: 2000 mg via TOPICAL

## 2020-12-08 MED ORDER — DEXAMETHASONE SODIUM PHOSPHATE 10 MG/ML IJ SOLN
INTRAMUSCULAR | Status: DC | PRN
  Administered 2020-12-08: 5 mg via INTRAVENOUS

## 2020-12-08 MED ORDER — ONDANSETRON HCL 4 MG/2ML IJ SOLN
INTRAMUSCULAR | Status: DC | PRN
  Administered 2020-12-08: 4 mg via INTRAVENOUS

## 2020-12-08 MED ORDER — LISINOPRIL-HYDROCHLOROTHIAZIDE 20-12.5 MG PO TABS: 1.0000 | ORAL_TABLET | Freq: Every day | ORAL | Status: DC

## 2020-12-08 MED ORDER — BUPIVACAINE IN DEXTROSE 0.75-8.25 % IT SOLN
INTRATHECAL | Status: DC | PRN
  Administered 2020-12-08: 1.4 mL via INTRATHECAL

## 2020-12-08 MED ORDER — ONDANSETRON HCL 4 MG/2ML IJ SOLN: 4.0000 mg | Freq: Four times a day (QID) | INTRAMUSCULAR | Status: DC | PRN

## 2020-12-08 MED ORDER — ONDANSETRON HCL 4 MG/2ML IJ SOLN
INTRAMUSCULAR | Status: AC
  Filled 2020-12-08: qty 2

## 2020-12-08 MED ORDER — PANTOPRAZOLE SODIUM 40 MG PO TBEC
40.0000 mg | DELAYED_RELEASE_TABLET | Freq: Every day | ORAL | Status: DC
  Administered 2020-12-09 – 2020-12-10 (×2): 40 mg via ORAL
  Filled 2020-12-08 (×2): qty 1

## 2020-12-08 MED ORDER — HYDROMORPHONE HCL 1 MG/ML IJ SOLN: 0.5000 mg | INTRAMUSCULAR | Status: DC | PRN

## 2020-12-08 MED ORDER — FENTANYL CITRATE (PF) 250 MCG/5ML IJ SOLN
INTRAMUSCULAR | Status: DC | PRN
  Administered 2020-12-08: 50 ug via INTRAVENOUS

## 2020-12-08 MED ORDER — KETOROLAC TROMETHAMINE 15 MG/ML IJ SOLN
7.5000 mg | Freq: Four times a day (QID) | INTRAMUSCULAR | Status: AC
  Administered 2020-12-08 – 2020-12-09 (×4): 7.5 mg via INTRAVENOUS
  Filled 2020-12-08 (×3): qty 1

## 2020-12-08 MED ORDER — 0.9 % SODIUM CHLORIDE (POUR BTL) OPTIME
TOPICAL | Status: DC | PRN
  Administered 2020-12-08: 1000 mL

## 2020-12-08 MED ORDER — HYDROCHLOROTHIAZIDE 12.5 MG PO CAPS
12.5000 mg | ORAL_CAPSULE | Freq: Every day | ORAL | Status: DC
  Administered 2020-12-09: 12.5 mg via ORAL
  Filled 2020-12-08 (×3): qty 1

## 2020-12-08 MED ORDER — SODIUM CHLORIDE 0.9 % IR SOLN
Status: DC | PRN
  Administered 2020-12-08: 3000 mL

## 2020-12-08 SURGICAL SUPPLY — 63 items
BAG COUNTER SPONGE SURGICOUNT (BAG) ×2 IMPLANT
BAG SPNG CNTER NS LX DISP (BAG) ×1
BAG SURGICOUNT SPONGE COUNTING (BAG) ×1
BLADE SAGITTAL 25.0X1.19X90 (BLADE) ×2 IMPLANT
BLADE SAGITTAL 25.0X1.19X90MM (BLADE) ×1
BLADE SAW SGTL 13X75X1.27 (BLADE) ×3 IMPLANT
BLADE SURG 21 STRL SS (BLADE) ×6 IMPLANT
BNDG COHESIVE 6X5 TAN STRL LF (GAUZE/BANDAGES/DRESSINGS) ×6 IMPLANT
BNDG GAUZE ELAST 4 BULKY (GAUZE/BANDAGES/DRESSINGS) ×3 IMPLANT
BOWL SMART MIX CTS (DISPOSABLE) ×3 IMPLANT
BSPLAT TIB E 2 PG STRL KN RT (Stem) ×1 IMPLANT
COMP FEM CR PERS SZ7 RT (Joint) ×3 IMPLANT
COMPONENT FEM CR PERS SZ7 RT (Joint) IMPLANT
COOLER ICEMAN CLASSIC (MISCELLANEOUS) ×3 IMPLANT
COVER SURGICAL LIGHT HANDLE (MISCELLANEOUS) ×3 IMPLANT
CUFF TOURN SGL QUICK 34 (TOURNIQUET CUFF) ×3
CUFF TOURN SGL QUICK 42 (TOURNIQUET CUFF) IMPLANT
CUFF TRNQT CYL 34X4.125X (TOURNIQUET CUFF) ×1 IMPLANT
DRAPE DERMATAC (DRAPES) ×10 IMPLANT
DRAPE EXTREMITY T 121X128X90 (DISPOSABLE) ×3 IMPLANT
DRAPE HALF SHEET 40X57 (DRAPES) ×6 IMPLANT
DRAPE INCISE IOBAN 66X45 STRL (DRAPES) ×2 IMPLANT
DRAPE U-SHAPE 47X51 STRL (DRAPES) ×3 IMPLANT
DRSG ADAPTIC 3X8 NADH LF (GAUZE/BANDAGES/DRESSINGS) ×3 IMPLANT
DRSG PAD ABDOMINAL 8X10 ST (GAUZE/BANDAGES/DRESSINGS) ×3 IMPLANT
DURAPREP 26ML APPLICATOR (WOUND CARE) ×3 IMPLANT
ELECT REM PT RETURN 9FT ADLT (ELECTROSURGICAL) ×3
ELECTRODE REM PT RTRN 9FT ADLT (ELECTROSURGICAL) ×1 IMPLANT
FACESHIELD WRAPAROUND (MASK) ×3 IMPLANT
FACESHIELD WRAPAROUND OR TEAM (MASK) ×1 IMPLANT
GAUZE SPONGE 4X4 12PLY STRL (GAUZE/BANDAGES/DRESSINGS) ×3 IMPLANT
GLOVE SURG ORTHO LTX SZ9 (GLOVE) ×3 IMPLANT
GLOVE SURG UNDER POLY LF SZ9 (GLOVE) ×3 IMPLANT
GOWN STRL REUS W/ TWL XL LVL3 (GOWN DISPOSABLE) ×2 IMPLANT
GOWN STRL REUS W/TWL XL LVL3 (GOWN DISPOSABLE) ×6
HANDPIECE INTERPULSE COAX TIP (DISPOSABLE) ×3
HDLS TROCR DRIL PIN KNEE 75 (PIN) ×3
IMPL PATELLA METAL SZ32X10 (Joint) ×2 IMPLANT
INSERT TIB ARTISURF SZ6-7 (Insert) ×2 IMPLANT
KIT BASIN OR (CUSTOM PROCEDURE TRAY) ×3 IMPLANT
KIT TURNOVER KIT B (KITS) ×3 IMPLANT
MANIFOLD NEPTUNE II (INSTRUMENTS) ×3 IMPLANT
NS IRRIG 1000ML POUR BTL (IV SOLUTION) ×3 IMPLANT
NexGen complete knee solution headless trocar dril ×1 IMPLANT
PACK TOTAL JOINT (CUSTOM PROCEDURE TRAY) ×3 IMPLANT
PAD ARMBOARD 7.5X6 YLW CONV (MISCELLANEOUS) ×3 IMPLANT
PAD COLD SHLDR WRAP-ON (PAD) ×3 IMPLANT
PENCIL BUTTON HOLSTER BLD 10FT (ELECTRODE) ×3 IMPLANT
PIN DRILL HDLS TROCAR 75 4PK (PIN) ×1 IMPLANT
PREVENA RESTOR ARTHOFORM 46X30 (CANNISTER) ×2 IMPLANT
Persona the personalized knee system 2.5 mm female ×1 IMPLANT
SCREW FEMALE HEX FIX 25X2.5 (ORTHOPEDIC DISPOSABLE SUPPLIES) ×2 IMPLANT
SET HNDPC FAN SPRY TIP SCT (DISPOSABLE) ×1 IMPLANT
STAPLER VISISTAT 35W (STAPLE) ×3 IMPLANT
STEM TIBIAL TRAB SZE RT (Stem) ×2 IMPLANT
SUCTION FRAZIER HANDLE 10FR (MISCELLANEOUS)
SUCTION TUBE FRAZIER 10FR DISP (MISCELLANEOUS) IMPLANT
SUT VIC AB 0 CT1 27 (SUTURE) ×12
SUT VIC AB 0 CT1 27XBRD ANBCTR (SUTURE) ×1 IMPLANT
SUT VIC AB 1 CTX 36 (SUTURE)
SUT VIC AB 1 CTX36XBRD ANBCTR (SUTURE) IMPLANT
TOWEL GREEN STERILE (TOWEL DISPOSABLE) ×3 IMPLANT
TOWEL GREEN STERILE FF (TOWEL DISPOSABLE) ×3 IMPLANT

## 2020-12-08 NOTE — Anesthesia Procedure Notes (Signed)
Spinal  Patient location during procedure: OR Start time: 12/08/2020 7:29 AM End time: 12/08/2020 7:32 AM Reason for block: surgical anesthesia Staffing Performed: anesthesiologist  Anesthesiologist: Freddrick March, MD Preanesthetic Checklist Completed: patient identified, IV checked, risks and benefits discussed, surgical consent, monitors and equipment checked, pre-op evaluation and timeout performed Spinal Block Patient position: sitting Prep: DuraPrep and site prepped and draped Patient monitoring: cardiac monitor, continuous pulse ox and blood pressure Approach: midline Location: L3-4 Injection technique: single-shot Needle Needle type: Pencan  Needle gauge: 24 G Needle length: 9 cm Assessment Sensory level: T6 Events: CSF return Additional Notes Functioning IV was confirmed and monitors were applied. Sterile prep and drape, including hand hygiene and sterile gloves were used. The patient was positioned and the spine was prepped. The skin was anesthetized with lidocaine.  Free flow of clear CSF was obtained prior to injecting local anesthetic into the CSF.  The spinal needle aspirated freely following injection.  The needle was carefully withdrawn.  The patient tolerated the procedure well.

## 2020-12-08 NOTE — Telephone Encounter (Signed)
Any suggestions for this pt

## 2020-12-08 NOTE — Discharge Instructions (Addendum)

## 2020-12-08 NOTE — Op Note (Signed)
DATE OF SURGERY:  12/08/2020  TIME: 9:25 AM  PATIENT NAME:  [name]    AGE: 64 y.o.    PRE-OPERATIVE DIAGNOSIS:  Osteoarthritis Right Knee  POST-OPERATIVE DIAGNOSIS:  Osteoarthritis Right Knee  PROCEDURE:  Procedure(s): RIGHT TOTAL KNEE ARTHROPLASTY  SURGEON: Meridee Score  ASSISTANT: Kendra  OPERATIVE IMPLANTS: Depuy , Posterior Stabilized.  Femur size 7, Tibia size E, Patella size 32 3-peg oval button, with a 12 mm polyethylene insert.  @ENCIMAGES @    PREOPERATIVE INDICATIONS:   Virginia Rios is a 65 y.o. year old female with end stage degenerative arthritis of the knee who failed conservative treatment and elected for Total Knee Arthroplasty.   The risks, benefits, and alternatives were discussed at length including but not limited to the risks of infection, bleeding, nerve injury, stiffness, blood clots, the need for revision surgery, cardiopulmonary complications, among others, and they were willing to proceed.  OPERATIVE DESCRIPTION:  The patient was brought to the operative room and placed in a supine position.  Anesthesia was administered.  IV antibiotics were given.  IV TXA was initiated.  The lower extremity was prepped and draped in the usual sterile fashion.  Charlie Pitter was used to cover all exposed skin. Time out was performed.    Anterior quadriceps tendon splitting approach was performed.  The patella was everted and osteophytes were removed.  The anterior horn of the medial and lateral meniscus was removed.   The distal femur was opened with the drill and the intramedullary distal femoral cutting jig was utilized, set at 5 degrees valgus resecting 9 mm off the distal femur.  Care was taken to protect the collateral ligaments.  Then the extramedullary tibial cutting jig was utilized set for 3 degree posterior slope.  Care was taken during the cut to protect the medial and collateral ligaments.  The proximal tibia was removed along with the posterior horns of  the menisci.  The PCL was sacrificed.    The extensor gap was measured and was approximately 12 mm.    The distal femoral sizing jig was applied, taking care to avoid notching.  Then the 4-in-1 cutting jig was applied and the anterior and posterior femur was cut, along with the chamfer cuts.  All posterior osteophytes were removed.  The flexion gap was then measured and was symmetric with the extension gap.  The distal femoral preparation using the appropriate jig to prepare the box.  The patella was then measured, and cut with the saw.    The proximal tibia sized and prepared accordingly with the reamer and the punch, and then all components were trialed with the poly insert.  The knee was found to have stable balance and full motion.  The knee was irrigated with normal saline, topical 2 g TXA was used to soak the wound.  The above named components were then press fit into place.  The final polyethylene component was placed.  The knee was then taken through a range of motion and the patella tracked well and the knee irrigated copiously and the parapatellar and subcutaneous tissue closed with vicryl, and skin closed with staples..  A sterile dressing was applied and patient  was taken to the PACU in stable  condition.  There were no complications.  Total tourniquet time was 37 minutes.

## 2020-12-08 NOTE — Anesthesia Procedure Notes (Signed)
Anesthesia Regional Block: Adductor canal block   Pre-Anesthetic Checklist: , timeout performed,  Correct Patient, Correct Site, Correct Laterality,  Correct Procedure, Correct Position, site marked,  Risks and benefits discussed,  Surgical consent,  Pre-op evaluation,  At surgeon's request and post-op pain management  Laterality: Right  Prep: Maximum Sterile Barrier Precautions used, chloraprep       Needles:  Injection technique: Single-shot  Needle Type: Echogenic Stimulator Needle     Needle Length: 9cm  Needle Gauge: 22     Additional Needles:   Procedures:,,,, ultrasound used (permanent image in chart),,    Narrative:  Start time: 12/08/2020 6:55 AM End time: 12/08/2020 7:05 AM Injection made incrementally with aspirations every 5 mL.  Performed by: Personally  Anesthesiologist: Freddrick March, MD  Additional Notes: Monitors applied. No increased pain on injection. No increased resistance to injection. Injection made in 5cc increments. Good needle visualization. Patient tolerated procedure well.

## 2020-12-08 NOTE — Progress Notes (Signed)
Recevied pt from PACU , accompanied by staff. Pt alert/oriented in no apparent distress. Situated/orientated to room/equipments. Welcome guide/menu provided with instructions. Pt/spouse verbalized understanding of instructions. Hospital valuables policy has been discussed . No complaints. Hospital bed in lowest position with 3 side rails up, call bell/room phone within reach and all wheels locked. Pt in bed resting comfortably.

## 2020-12-08 NOTE — H&P (Signed)
TOTAL KNEE ADMISSION H&P  Patient is being admitted for right total knee arthroplasty.  Subjective:  Chief Complaint:right knee pain.  HPI: Virginia Rios, 65 y.o. female, has a history of pain and functional disability in the right knee due to arthritis and has failed non-surgical conservative treatments for greater than 12 weeks to includeNSAID's and/or analgesics, corticosteriod injections, and activity modification.  Onset of symptoms was gradual, starting 5 years ago with gradually worsening course since that time. The patient noted no past surgery on the right knee(s).  Patient currently rates pain in the right knee(s) at 5 out of 10 with activity. Patient has night pain, pain that interferes with activities of daily living, and joint swelling.  Patient has evidence of subchondral cysts, periarticular osteophytes, and joint space narrowing by imaging studies. This patient has had  There is no active infection.  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   Hypertension 12/29/2011   Severe obesity (BMI >= 40) (HCC) 12/29/2011   Knee pain 12/29/2011   NEOPLASM, DIGESTIVE ORGANS,UNCERTAIN BEHAVIOR 09/38/1829   ABDOMINAL PAIN 06/25/2008   DYSPHAGIA UNSPECIFIED 06/24/2008   DYSPHAGIA 06/24/2008   GERD 06/23/2008   Past Medical History:  Diagnosis Date   Arthritis    Depression    Diabetes mellitus without complication (HCC)    GERD (gastroesophageal reflux disease)    Hyperlipidemia    used to take lipitor   Hypertension    PONV (postoperative nausea and vomiting)     Past Surgical History:  Procedure Laterality Date   ANKLE FUSION Right 03/31/2015   Procedure: Right Talonavicular and Subtalar Fusion;  Surgeon: Newt Minion, MD;  Location: [place];  Service: Orthopedics;  Laterality: Right;   CARPAL TUNNEL RELEASE     left hand   DILATION AND CURETTAGE OF UTERUS     2015   TUBAL LIGATION     1980s    Current  Facility-Administered Medications  Medication Dose Route Frequency Provider Last Rate Last Admin   ceFAZolin (ANCEF) IVPB 2g/100 mL premix  2 g Intravenous On Call to OR Gitel Beste, Bevely Palmer, PA       lactated ringers infusion   Intravenous Continuous Roderic Palau, MD       Allergies  Allergen Reactions   Kiwi Extract Anaphylaxis    Social History   Tobacco Use   Smoking status: Never   Smokeless tobacco: Never  Substance Use Topics   Alcohol use: No    Family History  Problem Relation Age of Onset   Alzheimer's disease Mother    Heart disease Mother    Diabetes Mother    Kidney disease Mother    Hypertension Mother    Stroke Mother    Alcohol abuse Father    Heart disease Father    Hyperlipidemia Sister    Hypertension Sister    Hyperlipidemia Brother    Hypertension Brother    Cancer Paternal Aunt        ovarian cancer in her 36s     Review of Systems  All other systems reviewed and are negative.  Objective:  Physical Exam  Patient is alert, oriented, no adenopathy, well-dressed, normal affect, normal respiratory effort. Examination patient has an antalgic gait she uses a cane.  She does have an elevated BMI.  She has crepitation with range of motion of both knees worse in the right knee than the left.  There is a mild effusion collaterals and  cruciates are stable in both knees.  She is tender to palpation of the medial lateral joint line worse on the right knee than the left knee.Heart RRR Lungs Clear Vital signs in last 24 hours: Temp:  [98.4 F (36.9 C)] 98.4 F (36.9 C) (06/29 0546) Pulse Rate:  [76] 76 (06/29 0546) Resp:  [17] 17 (06/29 0546) BP: (156)/(62) 156/62 (06/29 0546) SpO2:  [97 %] 97 % (06/29 0546) Weight:  [107.8 kg] 107.8 kg (06/29 0546)  Labs:   Estimated body mass index is 46.41 kg/m as calculated from the following:   Height as of this encounter: 5' (1.524 m).   Weight as of this encounter: 107.8 kg.   Imaging Review Plain  radiographs demonstrate moderate degenerative joint disease of the right knee(s). The overall alignment ismild varus. The bone quality appears to be good for age and reported activity level.      Assessment/Plan:  End stage arthritis, right knee   The patient history, physical examination, clinical judgment of the provider and imaging studies are consistent with end stage degenerative joint disease of the right knee(s) and total knee arthroplasty is deemed medically necessary. The treatment options including medical management, injection therapy arthroscopy and arthroplasty were discussed at length. The risks and benefits of total knee arthroplasty were presented and reviewed. The risks due to aseptic loosening, infection, stiffness, patella tracking problems, thromboembolic complications and other imponderables were discussed. The patient acknowledged the explanation, agreed to proceed with the plan and consent was signed. Patient is being admitted for inpatient treatment for surgery, pain control, PT, OT, prophylactic antibiotics, VTE prophylaxis, progressive ambulation and ADL's and discharge planning. The patient is planning to be discharged home with home health services     Patient's anticipated LOS is less than 2 midnights, meeting these requirements: - Younger than 44 - Lives within 1 hour of care - Has a competent adult at home to recover with post-op recover - NO history of  - Chronic pain requiring opiods  - Diabetes  - Coronary Artery Disease  - Heart failure  - Heart attack  - Stroke  - DVT/VTE  - Cardiac arrhythmia  - Respiratory Failure/COPD  - Renal failure  - Anemia  - Advanced Liver disease

## 2020-12-08 NOTE — Evaluation (Signed)
Physical Therapy Evaluation Patient Details Name: Virginia Rios MRN: [id number] DOB: 1956/03/20 Today's Date: 12/08/2020   History of Present Illness  Pt is a 65 y/o female s/p R TKA on 6/29. PMH includes HTN and DM.  Clinical Impression  Pt is s/p surgery above with deficits below. Mobility limited to chair secondary to pain. Requiring min A for transfers and bed mobility this session. Reviewed knee precautions. Will continue to follow acutely.     Follow Up Recommendations Follow surgeon's recommendation for DC plan and follow-up therapies    Equipment Recommendations  Rolling walker with 5" wheels;3in1 (PT)    Recommendations for Other Services       Precautions / Restrictions Precautions Precautions: Knee Precaution Booklet Issued: No Precaution Comments: Verbally reviewed knee precautions. Restrictions Weight Bearing Restrictions: Yes RLE Weight Bearing: Weight bearing as tolerated      Mobility  Bed Mobility Overal bed mobility: Needs Assistance Bed Mobility: Supine to Sit     Supine to sit: Min assist     General bed mobility comments: Min A for RLE assist. Increased time required secondary to pain    Transfers Overall transfer level: Needs assistance   Transfers: Sit to/from Stand;Stand Pivot Transfers Sit to Stand: Min assist Stand pivot transfers: Min assist       General transfer comment: Min A for lift assist and steadying to stand and transfer to chair. Pt walking on toes on RLE and with limited weightbearing. Mobility limited to chair secondary to pain  Ambulation/Gait                Stairs            Wheelchair Mobility    Modified Rankin (Stroke Patients Only)       Balance Overall balance assessment: Needs assistance Sitting-balance support: No upper extremity supported;Feet supported Sitting balance-Leahy Scale: Good     Standing balance support: Bilateral upper extremity supported;During functional activity Standing  balance-Leahy Scale: Poor Standing balance comment: Reliant on BUE support                             Pertinent Vitals/Pain Pain Assessment: Faces Faces Pain Scale: Hurts even more Pain Location: RLE Pain Descriptors / Indicators: Aching;Operative site guarding Pain Intervention(s): Limited activity within patient's tolerance;Monitored during session;Repositioned    Home Living Family/patient expects to be discharged to:: Private residence Living Arrangements: Spouse/significant other Available Help at Discharge: Family Type of Home: House Home Access: Stairs to enter Entrance Stairs-Rails: None Entrance Stairs-Number of Steps: 1 (threshold) Home Layout: Two level Home Equipment: Environmental consultant - 4 wheels      Prior Function Level of Independence: Independent with assistive device(s)         Comments: Used rollator for ambulation     Hand Dominance        Extremity/Trunk Assessment   Upper Extremity Assessment Upper Extremity Assessment: Overall WFL for tasks assessed    Lower Extremity Assessment Lower Extremity Assessment: RLE deficits/detail RLE Deficits / Details: Deficits consistent with post op pain and weakness.    Cervical / Trunk Assessment Cervical / Trunk Assessment: Normal  Communication   Communication: No difficulties  Cognition Arousal/Alertness: Awake/alert Behavior During Therapy: WFL for tasks assessed/performed Overall Cognitive Status: Within Functional Limits for tasks assessed  General Comments      Exercises     Assessment/Plan    PT Assessment Patient needs continued PT services  PT Problem List Decreased strength;Decreased range of motion;Decreased activity tolerance;Decreased balance;Decreased mobility;Decreased knowledge of use of DME;Pain;Decreased knowledge of precautions       PT Treatment Interventions DME instruction;Gait training;Stair training;Functional  mobility training;Therapeutic activities;Therapeutic exercise;Balance training;Patient/family education    PT Goals (Current goals can be found in the Care Plan section)  Acute Rehab PT Goals Patient Stated Goal: to go home PT Goal Formulation: With patient Time For Goal Achievement: 12/22/20 Potential to Achieve Goals: Good    Frequency 7X/week   Barriers to discharge        Co-evaluation               AM-PAC PT "6 Clicks" Mobility  Outcome Measure Help needed turning from your back to your side while in a flat bed without using bedrails?: A Little Help needed moving from lying on your back to sitting on the side of a flat bed without using bedrails?: A Little Help needed moving to and from a bed to a chair (including a wheelchair)?: A Little Help needed standing up from a chair using your arms (e.g., wheelchair or bedside chair)?: A Little Help needed to walk in hospital room?: A Little Help needed climbing 3-5 steps with a railing? : A Lot 6 Click Score: 17    End of Session Equipment Utilized During Treatment: Gait belt Activity Tolerance: Patient limited by pain Patient left: in chair;with call bell/phone within reach;with nursing/sitter in room Nurse Communication: Mobility status PT Visit Diagnosis: Other abnormalities of gait and mobility (R26.89);Pain Pain - Right/Left: Right Pain - part of body: Knee    Time: 2353-6144 PT Time Calculation (min) (ACUTE ONLY): 20 min   Charges:   PT Evaluation $PT Eval Low Complexity: 1 Low          Virginia Rios, DPT  Acute Rehabilitation Services  Pager: [phone] Office: [phone]   Virginia Rios 12/08/2020, 4:48 PM

## 2020-12-08 NOTE — Transfer of Care (Signed)
Immediate Anesthesia Transfer of Care Note  Patient: Virginia Rios  Procedure(s) Performed: RIGHT TOTAL KNEE ARTHROPLASTY (Right: Knee)  Patient Location: PACU  Anesthesia Type:MAC, Regional and Spinal  Level of Consciousness: awake  Airway & Oxygen Therapy: Patient Spontanous Breathing  Post-op Assessment: Report given to RN and Post -op Vital signs reviewed and stable  Post vital signs: Reviewed and stable  Last Vitals:  Vitals Value Taken Time  BP 116/72 12/08/20 0922  Temp    Pulse 59 12/08/20 0922  Resp 17 12/08/20 0922  SpO2 95 % 12/08/20 0922  Vitals shown include unvalidated device data.  Last Pain:  Vitals:   12/08/20 0610  TempSrc:   PainSc: 4       Patients Stated Pain Goal: 0 (44/62/86 3817)  Complications: No notable events documented.

## 2020-12-08 NOTE — Anesthesia Postprocedure Evaluation (Signed)
Anesthesia Post Note  Patient: Virginia Rios  Procedure(s) Performed: RIGHT TOTAL KNEE ARTHROPLASTY (Right: Knee)     Patient location during evaluation: PACU Anesthesia Type: Regional and Spinal Level of consciousness: oriented and awake and alert Pain management: pain level controlled Vital Signs Assessment: post-procedure vital signs reviewed and stable Respiratory status: spontaneous breathing, respiratory function stable and patient connected to nasal cannula oxygen Cardiovascular status: blood pressure returned to baseline and stable Postop Assessment: no headache, no backache and no apparent nausea or vomiting Anesthetic complications: no   No notable events documented.  Last Vitals:  Vitals:   12/08/20 1137 12/08/20 1207  BP: 130/82 118/74  Pulse: 73 63  Resp: 17 13  Temp:    SpO2: 94% 93%    Last Pain:  Vitals:   12/08/20 1137  TempSrc:   PainSc: 0-No pain                 Markon Jares L Mykelle Cockerell

## 2020-12-08 NOTE — TOC Progression Note (Signed)
Transition of Care [facility]) - Progression Note    Patient Details  Name: Virginia Rios MRN: [id number] Date of Birth: 1956-03-06  Transition of [facility]) CM/SW Contact  Milinda Antis, [place] Phone Number: 12/08/2020, 2:28 PM  Clinical Narrative:    Patient here for  Right Total Knee Arthroplasty.    TOC following patient for any d/c planning needs once medically stable.  Lind Covert, MSW, LCSWA        Expected Discharge Plan and Services                                                 Social Determinants of Health (SDOH) Interventions    Readmission Risk Interventions No flowsheet data found.

## 2020-12-08 NOTE — Interval H&P Note (Signed)
History and Physical Interval Note:  12/08/2020 6:46 AM  Virginia Rios  has presented today for surgery, with the diagnosis of Osteoarthritis Right Knee.  The various methods of treatment have been discussed with the patient and family. After consideration of risks, benefits and other options for treatment, the patient has consented to  Procedure(s): RIGHT TOTAL KNEE ARTHROPLASTY (Right) as a surgical intervention.  The patient's history has been reviewed, patient examined, no change in status, stable for surgery.  I have reviewed the patient's chart and labs.  Questions were answered to the patient's satisfaction.     Newt Minion

## 2020-12-09 ENCOUNTER — Other Ambulatory Visit: Payer: Self-pay

## 2020-12-09 ENCOUNTER — Encounter (HOSPITAL_COMMUNITY): Payer: Self-pay | Admitting: Orthopedic Surgery

## 2020-12-09 DIAGNOSIS — Z83438 Family history of other disorder of lipoprotein metabolism and other lipidemia: Secondary | ICD-10-CM | POA: Diagnosis not present

## 2020-12-09 DIAGNOSIS — Z96651 Presence of right artificial knee joint: Secondary | ICD-10-CM

## 2020-12-09 DIAGNOSIS — Z981 Arthrodesis status: Secondary | ICD-10-CM | POA: Diagnosis not present

## 2020-12-09 DIAGNOSIS — Z8249 Family history of ischemic heart disease and other diseases of the circulatory system: Secondary | ICD-10-CM | POA: Diagnosis not present

## 2020-12-09 DIAGNOSIS — Z20822 Contact with and (suspected) exposure to covid-19: Secondary | ICD-10-CM | POA: Diagnosis present

## 2020-12-09 DIAGNOSIS — Z6841 Body Mass Index (BMI) 40.0 and over, adult: Secondary | ICD-10-CM | POA: Diagnosis not present

## 2020-12-09 DIAGNOSIS — M1711 Unilateral primary osteoarthritis, right knee: Secondary | ICD-10-CM | POA: Diagnosis present

## 2020-12-09 DIAGNOSIS — Z823 Family history of stroke: Secondary | ICD-10-CM | POA: Diagnosis not present

## 2020-12-09 DIAGNOSIS — I10 Essential (primary) hypertension: Secondary | ICD-10-CM | POA: Diagnosis present

## 2020-12-09 DIAGNOSIS — Z841 Family history of disorders of kidney and ureter: Secondary | ICD-10-CM | POA: Diagnosis not present

## 2020-12-09 DIAGNOSIS — E119 Type 2 diabetes mellitus without complications: Secondary | ICD-10-CM | POA: Diagnosis present

## 2020-12-09 DIAGNOSIS — Z833 Family history of diabetes mellitus: Secondary | ICD-10-CM | POA: Diagnosis not present

## 2020-12-09 NOTE — Progress Notes (Signed)
Physical Therapy Treatment Patient Details Name: Virginia Rios MRN: [id number] DOB: September 25, 1955 Today's Date: 12/09/2020    History of Present Illness Pt is a 65 y/o female s/p R TKA on 6/29. PMH includes HTN and DM.    PT Comments    Pt received in supine, easily awoken from nap and agreeable to therapy session, with good participation and tolerance for transfer, gait and stair training. Emphasis on safe hand/foot placement with transfers and stairs, activity pacing, sequencing with RW, use of iceman to reduce pain/edema. Pt continues to benefit from PT services to progress toward functional mobility goals. Anticipate pt safe to DC home once medically cleared, will plan to review stairs with pt/spouse in AM prior to DC to ensure safety getting into home. Set up family training session for ~9:30 AM Fri.  Follow Up Recommendations  Follow surgeon's recommendation for DC plan and follow-up therapies     Equipment Recommendations  Rolling walker with 5" wheels;3in1 (PT)    Recommendations for Other Services       Precautions / Restrictions Precautions Precautions: Knee;Fall Precaution Booklet Issued: Yes (comment) Precaution Comments: portable wound vac; TKA handout given HEP/precs Restrictions Weight Bearing Restrictions: Yes RLE Weight Bearing: Weight bearing as tolerated    Mobility  Bed Mobility Overal bed mobility: Needs Assistance Bed Mobility: Supine to Sit     Supine to sit: Min guard     General bed mobility comments: Pt utilized leg lifter to assist RLE into bed with min guard for stability also, HOB flat per home setup.    Transfers Overall transfer level: Needs assistance Equipment used: Rolling walker (2 wheeled) Transfers: Sit to/from Stand Sit to Stand: Supervision         General transfer comment: Supervision from chair/EOB/BSC heights  Ambulation/Gait Ambulation/Gait assistance: Supervision Gait Distance (Feet): 80 Feet (86ft, 75ft) Assistive  device: Rolling walker (2 wheeled) Gait Pattern/deviations: Step-to pattern;Decreased weight shift to right;Decreased step length - left Gait velocity: decreased Gait velocity interpretation: <1.8 ft/sec, indicate of risk for recurrent falls General Gait Details: cues for sequencing with RW and slightly improved step length/height, pt without LOB or buckling and good use of RW; improved gait tolerance in afternoon but continues to take short/low steps   Stairs Stairs: Yes Stairs assistance: Min guard Stair Management: Forwards;With walker;Step to pattern Number of Stairs: 1 General stair comments: pt ascended/descended single curb height step with RW per home setup; verbal/visual demo for regular height step but pt reports she can sleep on couch on 1st floor the first few days if needed as she has 16 steps to second floor bedroom and mostly no railing. Will plan to trial more steps tomorrow AM prior to DC.   Wheelchair Mobility    Modified Rankin (Stroke Patients Only)       Balance Overall balance assessment: Needs assistance Sitting-balance support: No upper extremity supported;Feet supported Sitting balance-Leahy Scale: Good     Standing balance support: Bilateral upper extremity supported;During functional activity Standing balance-Leahy Scale: Poor Standing balance comment: Reliant on BUE support for dynamic tasks and U UE support for static standing at sink/EOB                            Cognition Arousal/Alertness: Awake/alert Behavior During Therapy: WFL for tasks assessed/performed Overall Cognitive Status: Within Functional Limits for tasks assessed  Exercises Total Joint Exercises Ankle Circles/Pumps: AROM;Both;15 reps;Supine Quad Sets: AROM;Both;10 reps;Supine Heel Slides: AROM;Right;Supine;5 reps Long Arc Quad: AROM;Right;10 reps;Seated (unable to reach TKE, pain/edema limiting)    General  Comments General comments (skin integrity, edema, etc.): VSS on RA      Pertinent Vitals/Pain Pain Assessment: 0-10 Pain Score: 7  Pain Location: RLE with weight bearing Pain Descriptors / Indicators: Aching;Operative site guarding;Discomfort Pain Intervention(s): Monitored during session;Premedicated before session;Repositioned;Other (comment) (NT refilled iceman, plans to assist her to don it once pt done in bathroom)    Home Living                      Prior Function            PT Goals (current goals can now be found in the care plan section) Acute Rehab PT Goals Patient Stated Goal: to go home PT Goal Formulation: With patient Time For Goal Achievement: 12/22/20 Progress towards PT goals: Progressing toward goals    Frequency    7X/week      PT Plan Current plan remains appropriate    Co-evaluation              AM-PAC PT "6 Clicks" Mobility   Outcome Measure  Help needed turning from your back to your side while in a flat bed without using bedrails?: A Little Help needed moving from lying on your back to sitting on the side of a flat bed without using bedrails?: A Little Help needed moving to and from a bed to a chair (including a wheelchair)?: A Little Help needed standing up from a chair using your arms (e.g., wheelchair or bedside chair)?: A Little Help needed to walk in hospital room?: A Little Help needed climbing 3-5 steps with a railing? : A Lot 6 Click Score: 17    End of Session Equipment Utilized During Treatment: Gait belt Activity Tolerance: Patient tolerated treatment well Patient left: in chair;with call bell/phone within reach (pt seated on BSC in bathroom, NT present in room to help her back) Nurse Communication: Mobility status PT Visit Diagnosis: Other abnormalities of gait and mobility (R26.89);Pain Pain - Right/Left: Right Pain - part of body: Knee     Time: 1610-9604 PT Time Calculation (min) (ACUTE ONLY): 37  min  Charges:  $Gait Training: 23-37 mins                     Linzee Depaul P., PTA Acute Rehabilitation Services Pager: [phone] Office: [place] 12/09/2020, 4:51 PM

## 2020-12-09 NOTE — Progress Notes (Signed)
Physical Therapy Treatment Patient Details Name: Virginia Rios MRN: [id number] DOB: Jun 10, 1956 Today's Date: 12/09/2020    History of Present Illness Pt is a 65 y/o female s/p R TKA on 6/29. PMH includes HTN and DM.    PT Comments    Pt received in chair, pleasantly cooperative, oriented and agreeable to therapy session with good participation in transfer, gait and RLE exercise instruction. Pt requiring decreased assist with transfers/gait this date and VSS. Her gait distance remains limited due to pain/fatigue and we will plan to assess stair trial/gait progression in PM session. Pt continues to benefit from PT services to progress toward functional mobility goals.   Follow Up Recommendations  Follow surgeon's recommendation for DC plan and follow-up therapies     Equipment Recommendations  Rolling walker with 5" wheels;3in1 (PT)    Recommendations for Other Services       Precautions / Restrictions Precautions Precautions: Knee Precaution Booklet Issued: Yes (comment) Precaution Comments: TKA handout given HEP/precs Restrictions Weight Bearing Restrictions: Yes RLE Weight Bearing: Weight bearing as tolerated    Mobility  Bed Mobility Overal bed mobility: Needs Assistance Bed Mobility: Sit to Supine       Sit to supine: Min assist   General bed mobility comments: Min A for RLE assist. Increased time required secondary to pain, pt utilized leg lifter to assist RLE into bed but min guard to minA for stability also, HOB flat per home setup    Transfers Overall transfer level: Needs assistance Equipment used: Rolling walker (2 wheeled) Transfers: Sit to/from [facility] Sit to Stand: Min guard;Supervision Stand pivot transfers: Min guard       General transfer comment: min guard initially progressing to supervision from chair/BSC heights  Ambulation/Gait Ambulation/Gait assistance: Supervision Gait Distance (Feet): 30 Feet (65ft, seated break,  then 43ft in room) Assistive device: Rolling walker (2 wheeled) Gait Pattern/deviations: Step-to pattern;Decreased weight shift to right;Decreased step length - left     General Gait Details: cues for sequencing with RW and improved step length/height, pt without LOB or buckling and fair use of RW; limited due to pain, will plan to progress gait distance more in afternoon.   Stairs             Wheelchair Mobility    Modified Rankin (Stroke Patients Only)       Balance Overall balance assessment: Needs assistance Sitting-balance support: No upper extremity supported;Feet supported Sitting balance-Leahy Scale: Good     Standing balance support: Bilateral upper extremity supported;During functional activity Standing balance-Leahy Scale: Poor Standing balance comment: Reliant on BUE support for dynamic tasks and U UE support for static standing at sink/EOB                            Cognition Arousal/Alertness: Awake/alert Behavior During Therapy: WFL for tasks assessed/performed Overall Cognitive Status: Within Functional Limits for tasks assessed                                        Exercises Total Joint Exercises Ankle Circles/Pumps: AROM;Both;15 reps;Supine Quad Sets: AROM;Both;10 reps;Supine Gluteal Sets: AROM;Both;5 reps;Supine Short Arc Quad: AROM;Right;10 reps;Supine Heel Slides: AROM;AAROM;Right;10 reps;Supine Hip ABduction/ADduction: AROM;AAROM;Right;10 reps;Supine (AA for improved ease of sliding due to bed sheet friction) Straight Leg Raises: AAROM;AROM;Right;10 reps;Supine (able to get ~3" with AROM, AA for increased ROM but minimal quad  lag, fair control) Long Arc Quad: AROM;Right;10 reps;Seated (unable to reach TKE, pain/edema limiting) Knee Flexion: AROM;Right;10 reps;Seated (washcloth under foot for ease of bending) Goniometric ROM: R knee flexion ROM 2* to 60*    General Comments General comments (skin integrity, edema,  etc.): VSS on RA      Pertinent Vitals/Pain Pain Assessment: 0-10 Pain Score: 5  Faces Pain Scale: Hurts even more Pain Location: RLE increased to 6/10 with weight bearing Pain Descriptors / Indicators: Aching;Operative site guarding;Discomfort Pain Intervention(s): Limited activity within patient's tolerance;Monitored during session;Premedicated before session;Repositioned;Ice applied (iceman donned in chair at end of session)     PT Goals (current goals can now be found in the care plan section) Acute Rehab PT Goals Patient Stated Goal: to go home PT Goal Formulation: With patient Time For Goal Achievement: 12/22/20 Potential to Achieve Goals: Good Progress towards PT goals: Progressing toward goals    Frequency    7X/week      PT Plan Current plan remains appropriate       AM-PAC PT "6 Clicks" Mobility   Outcome Measure  Help needed turning from your back to your side while in a flat bed without using bedrails?: A Little Help needed moving from lying on your back to sitting on the side of a flat bed without using bedrails?: A Little Help needed moving to and from a bed to a chair (including a wheelchair)?: A Little Help needed standing up from a chair using your arms (e.g., wheelchair or bedside chair)?: A Little Help needed to walk in hospital room?: A Little Help needed climbing 3-5 steps with a railing? : A Lot 6 Click Score: 17    End of Session Equipment Utilized During Treatment: Gait belt Activity Tolerance: Patient tolerated treatment well Patient left: in chair;with call bell/phone within reach;with chair alarm set (yellow bone foam at ankle for improved knee extension in chair, ice packs surrounding joint) Nurse Communication: Mobility status PT Visit Diagnosis: Other abnormalities of gait and mobility (R26.89);Pain Pain - Right/Left: Right Pain - part of body: Knee     Time: 8889-1694 PT Time Calculation (min) (ACUTE ONLY): 55 min  Charges:   $Gait Training: 8-22 mins $Therapeutic Exercise: 23-37 mins $Therapeutic Activity: 8-22 mins                     Deshone Lyssy P., PTA Acute Rehabilitation Services Pager: [phone] Office: [place] 12/09/2020, 1:30 PM

## 2020-12-09 NOTE — Plan of Care (Signed)

## 2020-12-09 NOTE — TOC Initial Note (Signed)
Transition of Care [facility]) - Initial/Assessment Note    Patient Details  Name: Virginia Rios MRN: [id number] Date of Birth: July 05, 1955  Transition of Care [facility]) CM/SW Contact:    Sharin Mons, RN Phone Number: 12/09/2020, 11:00 AM  Clinical Narrative:                 Admitted s/p R TKA ,6/29.  From home with husband. PTA independent with ADL's, pt without DME usage.  Preoperatively home health PT arranged with [facility]. DME : rolling walker and 3 IN 1 will be delivered to bedside prior to d/c.  Pt states has no problems affording Rx meds.  Pt with noted post hospital f/u on AVS.  Pt requesting PTAR transportation services for transportation to home.   TOC team will continue to monitor and follow for TOC needs....  Expected Discharge Plan: [place] Barriers to Discharge: Continued Medical Work up   Patient Goals and CMS Choice     Choice offered to / list presented to : Patient  Expected Discharge Plan and Services Expected Discharge Plan: [place]   Discharge Planning Services: CM Consult                     DME Arranged: 3-N-1, [place] Rile rolling   Date DME Agency Contacted: 12/09/20 Time DME Agency Contacted: [id number] Representative spoke with at DME Agency: Freda Munro HH Arranged: PT [place]: [place] Date [place]: 12/09/20 Time [place]: 1059 Representative spoke with at [place]: [place] Arrangements/Services     Patient language and need for interpreter reviewed:: Yes Do you feel safe going back to the place where you live?: Yes      Need for Family Participation in Patient Care: Yes (Comment) Care giver support system in place?: Yes (comment)   Criminal Activity/Legal Involvement Pertinent to Current Situation/Hospitalization: No - Comment as needed  Activities of Daily Living   ADL Screening (condition at time of admission) Patient's cognitive  ability adequate to safely complete daily activities?: Yes Is the patient deaf or have difficulty hearing?: No Does the patient have difficulty seeing, even when wearing glasses/contacts?: No Does the patient have difficulty concentrating, remembering, or making decisions?: No Patient able to express need for assistance with ADLs?: Yes Does the patient have difficulty dressing or bathing?: No Independently performs ADLs?: Yes (appropriate for developmental age) Does the patient have difficulty walking or climbing stairs?: Yes Weakness of Legs: Both Weakness of Arms/Hands: None  Permission Sought/Granted   Permission granted to share information with : Yes, Verbal Permission Granted              Emotional Assessment Appearance:: Appears stated age Attitude/Demeanor/Rapport: Engaged Affect (typically observed): Accepting Orientation: : Oriented to Self, Oriented to Place, Oriented to  Time, Oriented to Situation Alcohol / Substance Use: Not Applicable Psych Involvement: No (comment)  Admission diagnosis:  Arthritis of right knee [M17.11] Patient Active Problem List   Diagnosis Date Noted   Arthritis of right knee 12/08/2020   Unilateral primary osteoarthritis, right knee    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

## 2020-12-09 NOTE — Progress Notes (Signed)
Patient ID: Virginia Rios, female   DOB: 29-Oct-1955, 65 y.o.   MRN: [id number] Patient is postoperative day 1 total knee arthroplasty.  There is no drainage in the wound VAC canister there is a good suction fit.  Patient has not been able to navigate stairs yet.  She states she has a flight of stairs to get up at her home.  Anticipate physical therapy 2 sessions today and discharge to home tomorrow.

## 2020-12-10 ENCOUNTER — Telehealth: Payer: Self-pay | Admitting: Orthopedic Surgery

## 2020-12-10 MED ORDER — OXYCODONE-ACETAMINOPHEN 5-325 MG PO TABS: 1.0000 | ORAL_TABLET | ORAL | 0 refills | Status: DC | PRN

## 2020-12-10 MED ORDER — OXYCODONE HCL 5 MG PO TABS: 5.0000 mg | ORAL_TABLET | ORAL | 0 refills | Status: DC | PRN

## 2020-12-10 MED ORDER — ASPIRIN 325 MG PO TBEC: 325.0000 mg | DELAYED_RELEASE_TABLET | Freq: Every day | ORAL | 0 refills | Status: AC

## 2020-12-10 NOTE — Discharge Summary (Signed)
Discharge Diagnoses:  Active Problems:   Unilateral primary osteoarthritis, right knee   Arthritis of right knee   S/P TKR (total knee replacement), right   Surgeries: Procedure(s): RIGHT TOTAL KNEE ARTHROPLASTY on 12/08/2020    Consultants:   Discharged Condition: Improved  Hospital Course: Virginia Rios is an 65 y.o. female who was admitted 12/08/2020 with a chief complaint of Right knee arthritis with a final diagnosis of Osteoarthritis Right Knee.  Patient was brought to the operating room on 12/08/2020 and underwent Procedure(s): RIGHT TOTAL KNEE ARTHROPLASTY.    Patient was given perioperative antibiotics:  Anti-infectives (From admission, onward)    Start     Dose/Rate Route Frequency Ordered Stop   12/08/20 1400  ceFAZolin (ANCEF) IVPB 2g/100 mL premix        2 g 200 mL/hr over 30 Minutes Intravenous Every 6 hours 12/08/20 1300 12/08/20 2021   12/08/20 0600  ceFAZolin (ANCEF) IVPB 2g/100 mL premix        2 g 200 mL/hr over 30 Minutes Intravenous On call to O.R. 12/08/20 8341 12/08/20 0744     .  Patient was given sequential compression devices, early ambulation, and aspirin for DVT prophylaxis.  Recent vital signs: Patient Vitals for the past 24 hrs:  BP Temp Temp src Pulse Resp SpO2  12/09/20 2135 (!) 113/51 99.6 F (37.6 C) -- 71 16 92 %  12/09/20 2040 -- (!) 101.1 F (38.4 C) Oral -- -- --  12/09/20 1619 129/75 99 F (37.2 C) Oral 74 20 97 %  12/09/20 1210 (!) 116/43 98.6 F (37 C) Oral 65 18 97 %  12/09/20 0745 128/66 98.6 F (37 C) Oral 84 14 94 %  .  Recent laboratory studies: No results found.  Discharge Medications:   Allergies as of 12/10/2020       Reactions   Kiwi Extract Anaphylaxis        Medication List     STOP taking these medications    acetaminophen 650 MG CR tablet Commonly known as: TYLENOL       TAKE these medications    albuterol 108 (90 Base) MCG/ACT inhaler Commonly known as: VENTOLIN HFA Inhale 2 puffs into the  lungs every 4 (four) hours as needed for wheezing.   aspirin 325 MG EC tablet Take 1 tablet (325 mg total) by mouth daily with breakfast.   lisinopril-hydrochlorothiazide 20-12.5 MG tablet Commonly known as: ZESTORETIC Take 1 tablet by mouth daily.   oxyCODONE-acetaminophen 5-325 MG tablet Commonly known as: Percocet Take 1 tablet by mouth every 4 (four) hours as needed.   pantoprazole 40 MG tablet Commonly known as: PROTONIX Take 40 mg by mouth daily.   sertraline 50 MG tablet Commonly known as: ZOLOFT Take 50 mg by mouth daily.   simvastatin 40 MG tablet Commonly known as: ZOCOR Take 40 mg by mouth at bedtime.   Systane 0.4-0.3 % Soln Generic drug: Polyethyl Glycol-Propyl Glycol Place 1 drop into both eyes daily as needed (dry eyes).               Durable Medical Equipment  (From admission, onward)           Start     Ordered   12/09/20 1405  For home use only DME 3 n 1  Once        12/09/20 1405   12/09/20 1405  For home use only DME Walker rolling  Once       Question Answer Comment  Walker:  With 5 Inch Wheels   Patient needs a walker to treat with the following condition Weakness      12/09/20 1405            Diagnostic Studies: No results found.  Patient benefited maximally from their hospital stay and there were no complications.     Disposition: Discharge disposition: 01-Home or Self Care      Discharge Instructions     Call MD / Call 911   Complete by: As directed    If you experience chest pain or shortness of breath, CALL 911 and be transported to the hospital emergency room.  If you develope a fever above 101 F, pus (white drainage) or increased drainage or redness at the wound, or calf pain, call your surgeon's office.   Constipation Prevention   Complete by: As directed    Drink plenty of fluids.  Prune juice may be helpful.  You may use a stool softener, such as Colace (over the counter) 100 mg twice a day.  Use MiraLax  (over the counter) for constipation as needed.   Diet - low sodium heart healthy   Complete by: As directed    Increase activity slowly as tolerated   Complete by: As directed    Negative Pressure Wound Therapy - Incisional   Complete by: As directed    Show patient how to attach preveena vac. Call office if alarms   Post-operative opioid taper instructions:   Complete by: As directed    POST-OPERATIVE OPIOID TAPER INSTRUCTIONS: It is important to wean off of your opioid medication as soon as possible. If you do not need pain medication after your surgery it is ok to stop day one. Opioids include: Codeine, Hydrocodone(Norco, Vicodin), Oxycodone(Percocet, oxycontin) and hydromorphone amongst others.  Long term and even short term use of opiods can cause: Increased pain response Dependence Constipation Depression Respiratory depression And more.  Withdrawal symptoms can include Flu like symptoms Nausea, vomiting And more Techniques to manage these symptoms Hydrate well Eat regular healthy meals Stay active Use relaxation techniques(deep breathing, meditating, yoga) Do Not substitute Alcohol to help with tapering If you have been on opioids for less than two weeks and do not have pain than it is ok to stop all together.  Plan to wean off of opioids This plan should start within one week post op of your joint replacement. Maintain the same interval or time between taking each dose and first decrease the dose.  Cut the total daily intake of opioids by one tablet each day Next start to increase the time between doses. The last dose that should be eliminated is the evening dose.          Follow-up Information     Suzan Slick, NP Follow up in 1 week(s).   Specialty: Orthopedic Surgery Contact information: [place] [address] [place], [place] Follow up.   Specialty: [facility] Contact information: [place] [address] [phone]                  Signed: Bevely Palmer Falcon Mccaskey 12/10/2020, 7:41 AM

## 2020-12-10 NOTE — Telephone Encounter (Signed)
Pt is a right total knee today please see below and advise.

## 2020-12-10 NOTE — Telephone Encounter (Signed)
[facility] pharmacy calling to verify what prescription pt is supposed to be given from orders sent over this morning. The best call back number for them is [phone].

## 2020-12-10 NOTE — Progress Notes (Signed)
Physical Therapy Treatment Patient Details Name: Virginia Rios MRN: [id number] DOB: 1955-09-16 Today's Date: 12/10/2020    History of Present Illness Pt is a 65 y/o female s/p R TKA on 6/29. PMH includes HTN and DM.    PT Comments    Pt received in supine, agreeable to therapy session with encouragement and with good participation and fair tolerance for transfer and stair training. Pt limited due to pain (she was not able to have Rx pain meds in AM due to soft DBP per pt report, BP was improved/stable during session when orthostatics taken, RN notified) but able to perform bed mobiltiy, transfers, short gait distances and curb height step with 1-person physical assist (chair follow for safety due to pain). Pt reports 8/10 modified RPE (fatigue) at end of session. Pt spouse present for training and able to demonstrate proper guarding technique with patient for transfers, stairs and gait with gait belt utilized, pt issued personal gait belt. Pt continues to benefit from PT services to progress toward functional mobility goals.    Follow Up Recommendations  Follow surgeon's recommendation for DC plan and follow-up therapies;Other (comment) (pt requesting PTAR transport home, did discuss car transfers with her in case she needs to get into car at any time post-op; case mgr notified)     Equipment Recommendations  Rolling walker with 5" wheels;3in1 (PT)    Recommendations for Other Services       Precautions / Restrictions Precautions Precautions: Knee;Fall Precaution Booklet Issued: Yes (comment) Precaution Comments: portable wound vac; TKA handout given HEP/precs Restrictions Weight Bearing Restrictions: Yes RLE Weight Bearing: Weight bearing as tolerated    Mobility  Bed Mobility Overal bed mobility: Needs Assistance Bed Mobility: Supine to Sit     Supine to sit: Mod assist;HOB elevated;Min assist     General bed mobility comments: Pt utilized leg lifter to assist RLE into bed  with minA for stability, pt with increased pain since she did not have stronger pain meds in AM and needed modA via transfer pad for bilateral hip translation to EOB    Transfers Overall transfer level: Needs assistance Equipment used: Rolling walker (2 wheeled) Transfers: Sit to/from Stand Sit to Stand: Min guard;Min assist         General transfer comment: from EOB<>RW minA initially, progressing to min guard for safety due to increased pain this date  Ambulation/Gait Ambulation/Gait assistance: Supervision;Min guard Gait Distance (Feet): 50 Feet (3ft, 67ft, 74ft with seated breaks) Assistive device: Rolling walker (2 wheeled) Gait Pattern/deviations: Step-to pattern;Decreased weight shift to right;Decreased step length - left;Wide base of support;Antalgic;Decreased dorsiflexion - right;Decreased dorsiflexion - left Gait velocity: decreased Gait velocity interpretation: <1.8 ft/sec, indicate of risk for recurrent falls General Gait Details: cues for sequencing with RW and slightly improved step length/height, pt without LOB or buckling and good use of RW; improved gait tolerance in afternoon but continues to take short/low steps   Stairs Stairs: Yes Stairs assistance: Min assist Stair Management: Forwards;With walker;Step to pattern Number of Stairs: 3 General stair comments: pt ascended/descended single curb height step with RW per home setup for entrance into 1st floor; pt also able to perform regular height step with +2 minA for safety and RW x2 steps but pt unable to perform all 16 steps due to pain/fatigue (pt has second floor bedroom and mostly no railing and has shower only on second floor). Will likely need PTAR transport home to get to second floor bedroom vs stay on first floor which has  half bathroom until able to perform 16 steps with HHPT.   Wheelchair Mobility    Modified Rankin (Stroke Patients Only)       Balance Overall balance assessment: Needs  assistance Sitting-balance support: No upper extremity supported;Feet supported Sitting balance-Leahy Scale: Good     Standing balance support: Bilateral upper extremity supported;During functional activity Standing balance-Leahy Scale: Poor Standing balance comment: Reliant on BUE support for dynamic tasks and U UE support for static standing at [employer] Arousal/Alertness: Awake/alert Behavior During Therapy: [facility] for tasks assessed/performed Overall Cognitive Status: Within Functional Limits for tasks assessed                                 General Comments: cooperative, mild anxiety due to pain and reported low BP prior to session      Exercises Total Joint Exercises Ankle Circles/Pumps: AROM;Both;15 reps;Supine Heel Slides: Right;Supine;AROM;AAROM;10 reps Hip ABduction/ADduction: AAROM;Right;10 reps;Supine (AA with plastic bag under leg due to bed sheet friction and to prevent hip ER) Long Arc Quad: AROM;Right;10 reps;Seated (unable to reach TKE, pain/edema limiting) Knee Flexion: AROM;Right;5 reps;Seated (pain limiting ROM) Goniometric ROM: R knee flexion ROM 0* to ~60* seated (pt very limited ROM in supine) Bridges: AROM;Supine (1 rep with LLE only bent but difficulty due to pain)    General Comments General comments (skin integrity, edema, etc.): BP 134/62 supine, BP 132/62 seated, BP 120/55 standing, BP 113/56 after initial gait trial, seated; no dizziness or nausea, VSS on RA otherwise      Pertinent Vitals/Pain Pain Assessment: 0-10 Pain Score: 7  Pain Location: RLE with weight bearing, increased to 8-9 by end of session Pain Descriptors / Indicators: Aching;Operative site guarding;Discomfort Pain Intervention(s): Limited activity within patient's tolerance;Monitored during session;Premedicated before session;Repositioned;Patient requesting pain meds-RN notified;Ice applied (tylenol only in AM due to low BP; pt  may need stronger pain meds post-session)     PT Goals (current goals can now be found in the care plan section) Acute Rehab PT Goals Patient Stated Goal: to go home PT Goal Formulation: With patient Time For Goal Achievement: 12/22/20 Progress towards PT goals: Progressing toward goals    Frequency    7X/week      PT Plan Current plan remains appropriate       AM-PAC PT "6 Clicks" Mobility   Outcome Measure  Help needed turning from your back to your side while in a flat bed without using bedrails?: A Little Help needed moving from lying on your back to sitting on the side of a flat bed without using bedrails?: A Lot Help needed moving to and from a bed to a chair (including a wheelchair)?: A Little Help needed standing up from a chair using your arms (e.g., wheelchair or bedside chair)?: A Little Help needed to walk in hospital room?: A Little Help needed climbing 3-5 steps with a railing? : A Lot (+2 minA) 6 Click Score: 16    End of Session Equipment Utilized During Treatment: Gait belt;Other (comment) (portable wound vac) Activity Tolerance: Patient limited by pain;Patient tolerated treatment well Patient left: in chair;with call bell/phone within reach;with family/visitor present (spouse present, RN notified pt awaiting discharge paperwork and has questions regarding showering, etc) Nurse Communication: Mobility status;Patient requests pain meds;Other (comment) (pt and spouse were  upset no one helped her back from bathroom yesterday, although PTA had confirmed with NT to assist her back and pt stated she would use call bell and was A&O, NT was in room when PTA ended session and pt was on toilet at that time.) PT Visit Diagnosis: Other abnormalities of gait and mobility (R26.89);Pain Pain - Right/Left: Right Pain - part of body: Knee     Time: 7341-9379 PT Time Calculation (min) (ACUTE ONLY): 76 min  Charges:  $Gait Training: 23-37 mins $Therapeutic Exercise: 8-22  mins $Therapeutic Activity: 23-37 mins                     Dominigue Gellner P., PTA Acute Rehabilitation Services Pager: [phone] Office: [place] 12/10/2020, 11:25 AM

## 2020-12-10 NOTE — Progress Notes (Signed)
Discharge summary packet provided to pt/spouse with instructions. Pt/spouse verbalized understanding of instructions.Discussed with pt the prevena wound vacc extensively. All questions and concerns were fully addressed. No complaints. D/C to home with follow up [facility] services as ordered. Pt remains alert/oriented in no apparent distresss,PTAR is responsible for pt's ride back home.

## 2020-12-10 NOTE — Progress Notes (Signed)
Patient is postop day 2 status post total knee arthroplasty.  Overall she is doing well and has been working with physical therapy.  Her family is coming this morning to work with PT and her.  She has good pain control she denies any shortness of breath or chest pain.  Vital signs stable she did have a temperature to 1011.  Compartments are soft and nontender no calf pain Praveena VAC is functioning in place  Plan will be to discharge home with home health DME is needed.  Will write for discharge today.  Encourage deep breathing coughing and use of her inspirometer.

## 2020-12-10 NOTE — Telephone Encounter (Signed)
I tried 2x to send the rx and their escribe was down but apparently now is up. They can cancel as I told patient to get rx at [facility] on [address] which did have escribe working this morning

## 2020-12-10 NOTE — TOC Transition Note (Signed)
Transition of Care [facility]) - CM/SW Discharge Note   Patient Details  Name: Virginia Rios MRN: [id number] Date of Birth: 01-12-1956  Transition of Care [facility]) CM/SW Contact:  Sharin Mons, RN Phone Number: 12/10/2020, 10:02 AM   Clinical Narrative:    Patient will DC to: Home Anticipated DC date: 12/10/2020 Family notified:yes Transport by: Corey Harold      -s/p R TKA ,6/29 Per MD patient ready for DC today . RN, patient, patient's family, and facility notified of DC. Transportation forms on front of the chart. Ambulance transport requested for patient.   RNCM will sign off for now as intervention is no longer needed. Please consult [place] again if new needs arise.    Final next level of care: Home w Home Health Services Barriers to Discharge: No Barriers Identified   Patient Goals and CMS Choice     Choice offered to / list presented to : Patient  Discharge Placement                       Discharge Plan and Services   Discharge Planning Services: CM Consult            DME Arranged: 3-N-1, Walker rolling   Date DME Agency Contacted: 12/09/20 Time DME Agency Contacted: 1735 Representative spoke with at DME Agency: Freda Munro HH Arranged: PT [place]: [place] Date [place]: 12/09/20 Time [place]: 1059 Representative spoke with at [place]: [place] ([place]) Interventions     Readmission Risk Interventions No flowsheet data found.

## 2020-12-15 ENCOUNTER — Ambulatory Visit (INDEPENDENT_AMBULATORY_CARE_PROVIDER_SITE_OTHER): Payer: Medicare Other | Admitting: Family

## 2020-12-15 DIAGNOSIS — M1711 Unilateral primary osteoarthritis, right knee: Secondary | ICD-10-CM

## 2020-12-15 NOTE — Progress Notes (Signed)
   Post-Op Visit Note   Patient: Virginia Rios           Date of Birth: November 27, 1955           MRN: [id number] Visit Date: 12/15/2020 PCP: System, Provider Not In  Chief Complaint: No chief complaint on file.   HPI:  HPI The patient is a 65 year old woman seen in 1 week status post right total knee arthroplasty. Ortho Exam Incision well approximated with staples there is no erythema no gaping no drainage no sign of infection. has full extension.  Visit Diagnoses:  1. Primary osteoarthritis of right knee     Plan: Begin daily dose of cleansing.  Dry dressing changes.  Continue with home health physical therapy.  She will follow-up in 1 more week with radiographs of the right knee.  Follow-Up Instructions: Return in about 1 week (around 12/22/2020).   Imaging: No results found.  Orders:  No orders of the defined types were placed in this encounter.  No orders of the defined types were placed in this encounter.    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   Hypertension 12/29/2011   Severe obesity (BMI >= 40) (HCC) 12/29/2011   Knee pain 12/29/2011   NEOPLASM, DIGESTIVE ORGANS,UNCERTAIN BEHAVIOR 50/35/4656   ABDOMINAL PAIN 06/25/2008   DYSPHAGIA UNSPECIFIED 06/24/2008   DYSPHAGIA 06/24/2008   GERD 06/23/2008   Past Medical History:  Diagnosis Date   Arthritis    Depression    Diabetes mellitus without complication (HCC)    GERD (gastroesophageal reflux disease)    Hyperlipidemia    used to take lipitor   Hypertension    PONV (postoperative nausea and vomiting)     Family History  Problem Relation Age of Onset   Alzheimer's disease Mother    Heart disease Mother    Diabetes Mother    Kidney disease Mother    Hypertension Mother    Stroke  Mother    Alcohol abuse Father    Heart disease Father    Hyperlipidemia Sister    Hypertension Sister    Hyperlipidemia Brother    Hypertension Brother    Cancer Paternal Aunt        ovarian cancer in her 28s    Past Surgical History:  Procedure Laterality Date   ANKLE FUSION Right 03/31/2015   Procedure: Right Talonavicular and Subtalar Fusion;  Surgeon: Newt Minion, MD;  Location: [place];  Service: Orthopedics;  Laterality: Right;   CARPAL TUNNEL RELEASE     left hand   DILATION AND CURETTAGE OF UTERUS     2015   TOTAL KNEE ARTHROPLASTY Right 12/08/2020   Procedure: RIGHT TOTAL KNEE ARTHROPLASTY;  Surgeon: Newt Minion, MD;  Location: [place];  Service: Orthopedics;  Laterality: Right;   TUBAL LIGATION     1980s   Social History   Occupational History   Not on file  Tobacco Use   Smoking status: Never   Smokeless tobacco: Never  Substance and Sexual Activity   Alcohol use: No   Drug use: No   Sexual activity: Not on file

## 2020-12-22 ENCOUNTER — Ambulatory Visit (INDEPENDENT_AMBULATORY_CARE_PROVIDER_SITE_OTHER): Payer: Medicare Other | Admitting: Family

## 2020-12-22 ENCOUNTER — Other Ambulatory Visit: Payer: Self-pay

## 2020-12-22 ENCOUNTER — Encounter: Payer: Self-pay | Admitting: Family

## 2020-12-22 ENCOUNTER — Ambulatory Visit (INDEPENDENT_AMBULATORY_CARE_PROVIDER_SITE_OTHER): Payer: Medicare Other

## 2020-12-22 DIAGNOSIS — M1711 Unilateral primary osteoarthritis, right knee: Secondary | ICD-10-CM | POA: Diagnosis not present

## 2020-12-22 NOTE — Progress Notes (Signed)
Office Visit Note   Patient: Virginia Rios           Date of Birth: [birth year]           MRN: [id number] Visit Date: 12/22/2020              Requested by: No referring provider defined for this encounter. PCP: System, Provider Not In  Chief Complaint  Patient presents with   Right Knee - Routine Post Op      HPI: Patient is a 65 year old woman seen 2 weeks status post right total knee arthroplasty she is doing quite well she is completing home health physical therapy and will be moving to outpatient physical therapy next week she is pleased with her range of motion and pain control  Assessment & Plan: Visit Diagnoses:  1. Primary osteoarthritis of right knee     Plan: Will refer her to outpatient physical therapy.  Patient prefers to go to the [facility] physical therapy clinic.  Have placed an order for her.  Staples harvested today without incident  Follow-Up Instructions: No follow-ups on file.   Ortho Exam  Patient is alert, oriented, no adenopathy, well-dressed, normal affect, normal respiratory effort. On examination of the right knee the incision is healing well staples harvested today without incident.  There is no surrounding erythema no drainage no sign of infection.  She does have near full extension she has flexion to 90 degrees.  Imaging: No results found. No images are attached to the encounter.  Labs: No results found for: HGBA1C, ESRSEDRATE, CRP, LABURIC, REPTSTATUS, GRAMSTAIN, CULT, LABORGA   Lab Results  Component Value Date   ALBUMIN 3.8 03/23/2015    No results found for: MG No results found for: VD25OH  No results found for: PREALBUMIN CBC EXTENDED Latest Ref Rng & Units 12/06/2020 12/29/2011  WBC 4.0 - 10.5 K/uL 8.5 10.8(H)  RBC 3.87 - 5.11 MIL/uL 5.06 5.27(H)  HGB 12.0 - 15.0 g/dL 11.7(L) 12.4  HCT 36.0 - 46.0 % 39.9 41.8  PLT 150 - 400 K/uL 290 241  NEUTROABS 1.7 - 7.7 K/uL - 6.5  LYMPHSABS 0.7 - 4.0 K/uL - 3.3     There is no  height or weight on file to calculate BMI.  Orders:  Orders Placed This Encounter  Procedures   XR Knee 1-2 Views Right   No orders of the defined types were placed in this encounter.    Procedures: No procedures performed  Clinical Data: No additional findings.  ROS:  All other systems negative, except as noted in the HPI. Review of Systems  Objective: Vital Signs: There were no vitals taken for this visit.  Specialty Comments:  No specialty comments available.  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   Hypertension 12/29/2011   Severe obesity (BMI >= 40) (HCC) 12/29/2011   Knee pain 12/29/2011   NEOPLASM, DIGESTIVE ORGANS,UNCERTAIN BEHAVIOR 50/53/9767   ABDOMINAL PAIN 06/25/2008   DYSPHAGIA UNSPECIFIED 06/24/2008   DYSPHAGIA 06/24/2008   GERD 06/23/2008   Past Medical History:  Diagnosis Date   Arthritis    Depression    Diabetes mellitus without complication (HCC)    GERD (gastroesophageal reflux disease)    Hyperlipidemia    used to take lipitor   Hypertension  PONV (postoperative nausea and vomiting)     Family History  Problem Relation Age of Onset   Alzheimer's disease Mother    Heart disease Mother    Diabetes Mother    Kidney disease Mother    Hypertension Mother    Stroke Mother    Alcohol abuse Father    Heart disease Father    Hyperlipidemia Sister    Hypertension Sister    Hyperlipidemia Brother    Hypertension Brother    Cancer Paternal Aunt        ovarian cancer in her 52s    Past Surgical History:  Procedure Laterality Date   ANKLE FUSION Right 03/31/2015   Procedure: Right Talonavicular and Subtalar Fusion;  Surgeon: Newt Minion, MD;  Location: [place];  Service: Orthopedics;  Laterality: Right;   CARPAL  TUNNEL RELEASE     left hand   DILATION AND CURETTAGE OF UTERUS     2015   TOTAL KNEE ARTHROPLASTY Right 12/08/2020   Procedure: RIGHT TOTAL KNEE ARTHROPLASTY;  Surgeon: Newt Minion, MD;  Location: [place];  Service: Orthopedics;  Laterality: Right;   TUBAL LIGATION     1980s   Social History   Occupational History   Not on file  Tobacco Use   Smoking status: Never   Smokeless tobacco: Never  Substance and Sexual Activity   Alcohol use: No   Drug use: No   Sexual activity: Not on file

## 2020-12-24 ENCOUNTER — Telehealth: Payer: Self-pay | Admitting: Orthopedic Surgery

## 2020-12-24 ENCOUNTER — Other Ambulatory Visit: Payer: Self-pay | Admitting: Physician Assistant

## 2020-12-24 NOTE — Telephone Encounter (Signed)
Received call from [facility] with [facility] he advised the patient is very upset that the drug Percocet is in her chart. Sonia Side advised patient never picked up the Rx. Sonia Side said the patient don not want anyone to think that she took it and want Percocet removed from her chart. The number to contact patient is [phone]   The number to contact Sonia Side is [phone]

## 2020-12-24 NOTE — Progress Notes (Signed)
Patient called and requests percocet be deleted from her chart. She never pick up the prescription

## 2020-12-24 NOTE — Telephone Encounter (Signed)
done

## 2021-01-19 ENCOUNTER — Ambulatory Visit (INDEPENDENT_AMBULATORY_CARE_PROVIDER_SITE_OTHER): Payer: Medicare Other | Admitting: Physician Assistant

## 2021-01-19 ENCOUNTER — Encounter: Payer: Self-pay | Admitting: Physician Assistant

## 2021-01-19 DIAGNOSIS — M1711 Unilateral primary osteoarthritis, right knee: Secondary | ICD-10-CM

## 2021-01-19 NOTE — Progress Notes (Signed)
Office Visit Note   Patient: Virginia Rios           Date of Birth: December 20, 1955           MRN: [id number] Visit Date: 01/19/2021              Requested by: No referring provider defined for this encounter. PCP: System, Provider Not In  Chief Complaint  Patient presents with   Right Knee - Routine Post Op    12/08/20 right total knee replacement       HPI: Patient is a pleasant woman who is 6 weeks status post right total knee replacement.  She is doing physical therapy twice weekly and feels she is making progress.  She does notice some discoloration in her scar wonders if that is normal.  She has been exposing her scar to sun thinking it would help  Assessment & Plan: Visit Diagnoses: No diagnosis found.  Plan: Have told her that she needs to protect the scar from sun.  She can gently massage that using Mederma or vitamin E oil.  Follow-up for final visit in 1 month  Follow-Up Instructions: No follow-ups on file.   Ortho Exam  Patient is alert, oriented, no adenopathy, well-dressed, normal affect, normal respiratory effort. Examination of her knees she has well-healed surgical incision on the proximal and she does have some mild keloid scarring.  No surrounding erythema.  She has extension to full.  Flexion to about 95 degrees.  Compartments are soft and nontender no cellulitis  Imaging: No results found. No images are attached to the encounter.  Labs: No results found for: HGBA1C, ESRSEDRATE, CRP, LABURIC, REPTSTATUS, GRAMSTAIN, CULT, LABORGA   Lab Results  Component Value Date   ALBUMIN 3.8 03/23/2015    No results found for: MG No results found for: VD25OH  No results found for: PREALBUMIN CBC EXTENDED Latest Ref Rng & Units 12/06/2020 12/29/2011  WBC 4.0 - 10.5 K/uL 8.5 10.8(H)  RBC 3.87 - 5.11 MIL/uL 5.06 5.27(H)  HGB 12.0 - 15.0 g/dL 11.7(L) 12.4  HCT 36.0 - 46.0 % 39.9 41.8  PLT 150 - 400 K/uL 290 241  NEUTROABS 1.7 - 7.7 K/uL - 6.5  LYMPHSABS 0.7 - 4.0  K/uL - 3.3     There is no height or weight on file to calculate BMI.  Orders:  No orders of the defined types were placed in this encounter.  No orders of the defined types were placed in this encounter.    Procedures: No procedures performed  Clinical Data: No additional findings.  ROS:  All other systems negative, except as noted in the HPI. Review of Systems  Objective: Vital Signs: There were no vitals taken for this visit.  Specialty Comments:  No specialty comments available.  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   Hypertension 12/29/2011   Severe obesity (BMI >= 40) (HCC) 12/29/2011   Knee pain 12/29/2011   NEOPLASM, DIGESTIVE ORGANS,UNCERTAIN BEHAVIOR 0000000   ABDOMINAL PAIN 06/25/2008   DYSPHAGIA UNSPECIFIED 06/24/2008   DYSPHAGIA 06/24/2008   GERD 06/23/2008   Past Medical History:  Diagnosis Date   Arthritis    Depression    Diabetes mellitus without complication (HCC)    GERD (gastroesophageal reflux disease)    Hyperlipidemia  used to take lipitor   Hypertension    PONV (postoperative nausea and vomiting)     Family History  Problem Relation Age of Onset   Alzheimer's disease Mother    Heart disease Mother    Diabetes Mother    Kidney disease Mother    Hypertension Mother    Stroke Mother    Alcohol abuse Father    Heart disease Father    Hyperlipidemia Sister    Hypertension Sister    Hyperlipidemia Brother    Hypertension Brother    Cancer Paternal Aunt        ovarian cancer in her 16s    Past Surgical History:  Procedure Laterality Date   ANKLE FUSION Right 03/31/2015   Procedure: Right Talonavicular and Subtalar Fusion;  Surgeon: Newt Minion, MD;  Location: [place];  Service: Orthopedics;   Laterality: Right;   CARPAL TUNNEL RELEASE     left hand   DILATION AND CURETTAGE OF UTERUS     2015   TOTAL KNEE ARTHROPLASTY Right 12/08/2020   Procedure: RIGHT TOTAL KNEE ARTHROPLASTY;  Surgeon: Newt Minion, MD;  Location: [place];  Service: Orthopedics;  Laterality: Right;   TUBAL LIGATION     1980s   Social History   Occupational History   Not on file  Tobacco Use   Smoking status: Never   Smokeless tobacco: Never  Substance and Sexual Activity   Alcohol use: No   Drug use: No   Sexual activity: Not on file

## 2021-01-28 ENCOUNTER — Ambulatory Visit (INDEPENDENT_AMBULATORY_CARE_PROVIDER_SITE_OTHER): Payer: Medicare Other | Admitting: Physician Assistant

## 2021-01-28 ENCOUNTER — Encounter: Payer: Self-pay | Admitting: Physician Assistant

## 2021-01-28 ENCOUNTER — Other Ambulatory Visit: Payer: Self-pay | Admitting: Physician Assistant

## 2021-01-28 ENCOUNTER — Telehealth: Payer: Self-pay | Admitting: Orthopedic Surgery

## 2021-01-28 ENCOUNTER — Other Ambulatory Visit: Payer: Self-pay

## 2021-01-28 DIAGNOSIS — M1711 Unilateral primary osteoarthritis, right knee: Secondary | ICD-10-CM

## 2021-01-28 MED ORDER — IBUPROFEN 400 MG PO TABS: 400.0000 mg | ORAL_TABLET | Freq: Four times a day (QID) | ORAL | 0 refills | Status: DC | PRN

## 2021-01-28 NOTE — Telephone Encounter (Signed)
Please advise 

## 2021-01-28 NOTE — Progress Notes (Signed)
Office Visit Note   Patient: Virginia Rios           Date of Birth: [birth year]           MRN: [id number] Visit Date: 01/28/2021              Requested by: No referring provider defined for this encounter. PCP: System, Provider Not In  No chief complaint on file.     HPI: Patient presents today she is 7 weeks status post right total knee arthroplasty.  She just came from physical therapy and is overall doing well.  She complains though of tightness and swelling in her knee and swelling in her ankle.  She denies any fever any chills any calf pain.  She had been given information to get a compression stocking but was not sure what size to get.  Assessment & Plan: Visit Diagnoses: No diagnosis found.  Plan: Patient was measured for a 2 XL compression stocking.  She will follow-up at her regular visit.  Follow-Up Instructions: No follow-ups on file.   Ortho Exam  Patient is alert, oriented, no adenopathy, well-dressed, normal affect, normal respiratory effort. Examination demonstrates well-healed surgical incision.  She has no effusion no cellulitis.  Her calf is soft she has no tenderness to deep palpation over the venous complexes.  She has a negative Homans' sign.  She does have some swelling in her ankle into her foot but no cellulitis.  Her pulses are intact.  Imaging: No results found. No images are attached to the encounter.  Labs: No results found for: HGBA1C, ESRSEDRATE, CRP, LABURIC, REPTSTATUS, GRAMSTAIN, CULT, LABORGA   Lab Results  Component Value Date   ALBUMIN 3.8 03/23/2015    No results found for: MG No results found for: VD25OH  No results found for: PREALBUMIN CBC EXTENDED Latest Ref Rng & Units 12/06/2020 12/29/2011  WBC 4.0 - 10.5 K/uL 8.5 10.8(H)  RBC 3.87 - 5.11 MIL/uL 5.06 5.27(H)  HGB 12.0 - 15.0 g/dL 11.7(L) 12.4  HCT 36.0 - 46.0 % 39.9 41.8  PLT 150 - 400 K/uL 290 241  NEUTROABS 1.7 - 7.7 K/uL - 6.5  LYMPHSABS 0.7 - 4.0 K/uL - 3.3      There is no height or weight on file to calculate BMI.  Orders:  No orders of the defined types were placed in this encounter.  Meds ordered this encounter  Medications   ibuprofen (ADVIL) 400 MG tablet    Sig: Take 1 tablet (400 mg total) by mouth every 6 (six) hours as needed.    Dispense:  30 tablet    Refill:  0     Procedures: No procedures performed  Clinical Data: No additional findings.  ROS:  All other systems negative, except as noted in the HPI. Review of Systems  Objective: Vital Signs: There were no vitals taken for this visit.  Specialty Comments:  No specialty comments available.  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   Hypertension 12/29/2011   Severe obesity (BMI >= 40) (HCC) 12/29/2011   Knee pain 12/29/2011   NEOPLASM, DIGESTIVE ORGANS,UNCERTAIN BEHAVIOR 0000000   ABDOMINAL PAIN 06/25/2008   DYSPHAGIA UNSPECIFIED 06/24/2008   DYSPHAGIA 06/24/2008   GERD 06/23/2008   Past Medical History:  Diagnosis Date   Arthritis  Depression    Diabetes mellitus without complication (HCC)    GERD (gastroesophageal reflux disease)    Hyperlipidemia    used to take lipitor   Hypertension    PONV (postoperative nausea and vomiting)     Family History  Problem Relation Age of Onset   Alzheimer's disease Mother    Heart disease Mother    Diabetes Mother    Kidney disease Mother    Hypertension Mother    Stroke Mother    Alcohol abuse Father    Heart disease Father    Hyperlipidemia Sister    Hypertension Sister    Hyperlipidemia Brother    Hypertension Brother    Cancer Paternal Aunt        ovarian cancer in her 52s    Past Surgical History:  Procedure Laterality Date   ANKLE FUSION Right 03/31/2015    Procedure: Right Talonavicular and Subtalar Fusion;  Surgeon: Newt Minion, MD;  Location: [facility];  Service: Orthopedics;  Laterality: Right;   CARPAL TUNNEL RELEASE     left hand   DILATION AND CURETTAGE OF UTERUS     2015   TOTAL KNEE ARTHROPLASTY Right 12/08/2020   Procedure: RIGHT TOTAL KNEE ARTHROPLASTY;  Surgeon: Newt Minion, MD;  Location: [facility];  Service: Orthopedics;  Laterality: Right;   TUBAL LIGATION     1980s   Social History   Occupational History   Not on file  Tobacco Use   Smoking status: Never   Smokeless tobacco: Never  Substance and Sexual Activity   Alcohol use: No   Drug use: No   Sexual activity: Not on file

## 2021-01-28 NOTE — Telephone Encounter (Signed)
Pt stating she is having lots of swelling from the knee down to the ankle on the right side. Pt asked if an anti inflammatory can be called in for her to help with the swelling and pain. The best pharmacy is the one on file and the best call back number is [phone].

## 2021-01-28 NOTE — Telephone Encounter (Signed)
She needs to be seen in office today

## 2021-01-28 NOTE — Telephone Encounter (Signed)
I spoke with patient, she is coming in to office at 1:15pm this afternoon.

## 2021-02-16 ENCOUNTER — Ambulatory Visit: Payer: TRICARE For Life (TFL) | Admitting: Physician Assistant

## 2021-02-23 ENCOUNTER — Ambulatory Visit (INDEPENDENT_AMBULATORY_CARE_PROVIDER_SITE_OTHER): Payer: Medicare Other | Admitting: Family

## 2021-02-23 ENCOUNTER — Ambulatory Visit (INDEPENDENT_AMBULATORY_CARE_PROVIDER_SITE_OTHER): Payer: Medicare Other

## 2021-02-23 DIAGNOSIS — M25561 Pain in right knee: Secondary | ICD-10-CM | POA: Diagnosis not present

## 2021-02-23 DIAGNOSIS — G8929 Other chronic pain: Secondary | ICD-10-CM

## 2021-02-23 MED ORDER — IBUPROFEN 400 MG PO TABS: 400.0000 mg | ORAL_TABLET | Freq: Four times a day (QID) | ORAL | 0 refills | Status: AC | PRN

## 2021-02-23 NOTE — Progress Notes (Signed)
Office Visit Note   Patient: Virginia Rios           Date of Birth: [birth year]           MRN: [id number] Visit Date: 02/23/2021              Requested by: No referring provider defined for this encounter. PCP: System, Provider Not In  Chief Complaint  Patient presents with   Right Knee - Pain      HPI: The patient is a 65 year old woman who presents today in postoperative follow-up she is quite pleased with her progress she has no major complaints of pain she has been able to walk her dog and has resumed all of her preoperative activities.  Only having some stiffness with prolonged sitting or prolonged standing  Does request repeat radiographs today.  Assessment & Plan: Visit Diagnoses:  1. Chronic pain of right knee     Plan: Reassurance provided.  Will refill her ibuprofen 1 last time.  Discussed not taking it on a chronic basis she will follow-up in the office as needed  Follow-Up Instructions: No follow-ups on file.   Ortho Exam  Patient is alert, oriented, no adenopathy, well-dressed, normal affect, normal respiratory effort. On examination of the right knee there is minimal edema.  She has fluid range of motion she has full extension flexion to 110.  Her incision is well-healed.  Imaging: No results found. No images are attached to the encounter.  Labs: No results found for: HGBA1C, ESRSEDRATE, CRP, LABURIC, REPTSTATUS, GRAMSTAIN, CULT, LABORGA   Lab Results  Component Value Date   ALBUMIN 3.8 03/23/2015    No results found for: MG No results found for: VD25OH  No results found for: PREALBUMIN CBC EXTENDED Latest Ref Rng & Units 12/06/2020 12/29/2011  WBC 4.0 - 10.5 K/uL 8.5 10.8(H)  RBC 3.87 - 5.11 MIL/uL 5.06 5.27(H)  HGB 12.0 - 15.0 g/dL 11.7(L) 12.4  HCT 36.0 - 46.0 % 39.9 41.8  PLT 150 - 400 K/uL 290 241  NEUTROABS 1.7 - 7.7 K/uL - 6.5  LYMPHSABS 0.7 - 4.0 K/uL - 3.3     There is no height or weight on file to calculate BMI.  Orders:   Orders Placed This Encounter  Procedures   XR Knee 1-2 Views Right   No orders of the defined types were placed in this encounter.    Procedures: No procedures performed  Clinical Data: No additional findings.  ROS:  All other systems negative, except as noted in the HPI. Review of Systems  Objective: Vital Signs: There were no vitals taken for this visit.  Specialty Comments:  No specialty comments available.  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   Hypertension 12/29/2011   Severe obesity (BMI >= 40) (HCC) 12/29/2011   Knee pain 12/29/2011   NEOPLASM, DIGESTIVE ORGANS,UNCERTAIN BEHAVIOR 0000000   ABDOMINAL PAIN 06/25/2008   DYSPHAGIA UNSPECIFIED 06/24/2008   DYSPHAGIA 06/24/2008   GERD 06/23/2008   Past Medical History:  Diagnosis Date   Arthritis    Depression    Diabetes mellitus without complication (HCC)    GERD (gastroesophageal reflux disease)    Hyperlipidemia    used to take lipitor   Hypertension    PONV (postoperative nausea and vomiting)  Family History  Problem Relation Age of Onset   Alzheimer's disease Mother    Heart disease Mother    Diabetes Mother    Kidney disease Mother    Hypertension Mother    Stroke Mother    Alcohol abuse Father    Heart disease Father    Hyperlipidemia Sister    Hypertension Sister    Hyperlipidemia Brother    Hypertension Brother    Cancer Paternal Aunt        ovarian cancer in her 35s    Past Surgical History:  Procedure Laterality Date   ANKLE FUSION Right 03/31/2015   Procedure: Right Talonavicular and Subtalar Fusion;  Surgeon: Newt Minion, MD;  Location: [facility];  Service: Orthopedics;  Laterality: Right;   CARPAL TUNNEL RELEASE     left hand   DILATION AND CURETTAGE  OF UTERUS     2015   TOTAL KNEE ARTHROPLASTY Right 12/08/2020   Procedure: RIGHT TOTAL KNEE ARTHROPLASTY;  Surgeon: Newt Minion, MD;  Location: [facility];  Service: Orthopedics;  Laterality: Right;   TUBAL LIGATION     1980s   Social History   Occupational History   Not on file  Tobacco Use   Smoking status: Never   Smokeless tobacco: Never  Substance and Sexual Activity   Alcohol use: No   Drug use: No   Sexual activity: Not on file

## 2022-06-03 ENCOUNTER — Emergency Department (HOSPITAL_BASED_OUTPATIENT_CLINIC_OR_DEPARTMENT_OTHER): Payer: Medicare PPO | Admitting: Radiology

## 2022-06-03 ENCOUNTER — Encounter (HOSPITAL_BASED_OUTPATIENT_CLINIC_OR_DEPARTMENT_OTHER): Payer: Self-pay | Admitting: Emergency Medicine

## 2022-06-03 ENCOUNTER — Emergency Department (HOSPITAL_BASED_OUTPATIENT_CLINIC_OR_DEPARTMENT_OTHER)
Admission: EM | Admit: 2022-06-03 | Discharge: 2022-06-03 | Disposition: A | Discharge status: Home / Self Care | Payer: Medicare PPO | Attending: Emergency Medicine | Admitting: Emergency Medicine

## 2022-06-03 DIAGNOSIS — J209 Acute bronchitis, unspecified: Secondary | ICD-10-CM | POA: Insufficient documentation

## 2022-06-03 DIAGNOSIS — Z7982 Long term (current) use of aspirin: Secondary | ICD-10-CM | POA: Diagnosis not present

## 2022-06-03 DIAGNOSIS — R509 Fever, unspecified: Secondary | ICD-10-CM | POA: Diagnosis present

## 2022-06-03 DIAGNOSIS — Z20822 Contact with and (suspected) exposure to covid-19: Secondary | ICD-10-CM | POA: Insufficient documentation

## 2022-06-03 DIAGNOSIS — J069 Acute upper respiratory infection, unspecified: Secondary | ICD-10-CM | POA: Diagnosis not present

## 2022-06-03 LAB — RESP PANEL BY RT-PCR (RSV, FLU A&B, COVID)  RVPGX2
Influenza A by PCR: NEGATIVE
Influenza B by PCR: NEGATIVE
Resp Syncytial Virus by PCR: NEGATIVE
SARS Coronavirus 2 by RT PCR: NEGATIVE

## 2022-06-03 MED ORDER — DEXAMETHASONE SODIUM PHOSPHATE 10 MG/ML IJ SOLN
10.0000 mg | Freq: Once | INTRAMUSCULAR | Status: AC
  Administered 2022-06-03: 10 mg via INTRAMUSCULAR
  Filled 2022-06-03: qty 1

## 2022-06-03 MED ORDER — ALBUTEROL SULFATE HFA 108 (90 BASE) MCG/ACT IN AERS
2.0000 | INHALATION_SPRAY | RESPIRATORY_TRACT | Status: DC | PRN
  Filled 2022-06-03: qty 6.7

## 2022-06-03 MED ORDER — BENZONATATE 100 MG PO CAPS: 100.0000 mg | ORAL_CAPSULE | Freq: Three times a day (TID) | ORAL | 0 refills | Status: AC | PRN

## 2022-06-03 MED ORDER — IPRATROPIUM-ALBUTEROL 0.5-2.5 (3) MG/3ML IN SOLN
6.0000 mL | Freq: Once | RESPIRATORY_TRACT | Status: AC
  Administered 2022-06-03: 6 mL via RESPIRATORY_TRACT
  Filled 2022-06-03: qty 6

## 2022-06-03 MED ORDER — METHYLPREDNISOLONE 4 MG PO TBPK: ORAL_TABLET | ORAL | 0 refills | Status: AC

## 2022-06-03 MED ORDER — ALBUTEROL SULFATE HFA 108 (90 BASE) MCG/ACT IN AERS
2.0000 | INHALATION_SPRAY | Freq: Once | RESPIRATORY_TRACT | Status: AC
  Administered 2022-06-03: 2 via RESPIRATORY_TRACT

## 2022-06-03 NOTE — ED Triage Notes (Signed)
Patient presents C/O cough, subjective fever, congestion, wheezing. Onset 1 week ago.

## 2022-06-03 NOTE — ED Provider Notes (Signed)
[facility] EMERGENCY DEPT Provider Note   CSN: [id number] Arrival date & time: 06/03/22  1117     History  Chief Complaint  Patient presents with   Nasal Congestion   Cough   Fever    Virginia Rios is a 66 y.o. female who presents emergency department with a chief complaint of flulike symptoms.  Patient had onset of cough, subjective fever and congestion with wheezing beginning 1 week ago.  She has progressively worsening wheezing, shortness of breath.  She has no previous history of the same, no known history of asthma.   Cough Associated symptoms: fever   Fever Associated symptoms: cough        Home Medications Prior to Admission medications   Medication Sig Start Date End Date Taking? Authorizing Provider  albuterol (PROVENTIL HFA;VENTOLIN HFA) 108 (90 BASE) MCG/ACT inhaler Inhale 2 puffs into the lungs every 4 (four) hours as needed for wheezing. 04/15/12   Awilda Metro, NP  aspirin EC 325 MG EC tablet Take 1 tablet (325 mg total) by mouth daily with breakfast. 12/10/20   Persons, Bevely Palmer, PA  ibuprofen (ADVIL) 400 MG tablet Take 1 tablet (400 mg total) by mouth every 6 (six) hours as needed. 02/23/21   Suzan Slick, NP  lisinopril-hydrochlorothiazide (PRINZIDE,ZESTORETIC) 20-12.5 MG tablet Take 1 tablet by mouth daily. 03/07/15   [provider]  pantoprazole (PROTONIX) 40 MG tablet Take 40 mg by mouth daily. 02/01/15   [provider]  Polyethyl Glycol-Propyl Glycol (SYSTANE) 0.4-0.3 % SOLN Place 1 drop into both eyes daily as needed (dry eyes).    [provider]  sertraline (ZOLOFT) 50 MG tablet Take 50 mg by mouth daily. 01/20/15   [provider]  simvastatin (ZOCOR) 40 MG tablet Take 40 mg by mouth at bedtime. 01/11/15   [provider]      Allergies    Kiwi extract    Review of Systems   Review of Systems  Constitutional:  Positive for fever.  Respiratory:  Positive for cough.     Physical  Exam Updated Vital Signs BP (!) 192/95   Pulse 78   Temp 98.7 F (37.1 C)   Resp 18   Ht '5\' 3"'$  (1.6 m)   Wt 113.4 kg   SpO2 96%   BMI 44.29 kg/m  Physical Exam Vitals and nursing note reviewed.  Constitutional:      General: She is not in acute distress.    Appearance: She is well-developed. She is not diaphoretic.  HENT:     Head: Normocephalic and atraumatic.     Right Ear: External ear normal.     Left Ear: External ear normal.     Nose: Nose normal.     Mouth/Throat:     Mouth: Mucous membranes are moist.  Eyes:     General: No scleral icterus.    Conjunctiva/sclera: Conjunctivae normal.  Cardiovascular:     Rate and Rhythm: Normal rate and regular rhythm.     Heart sounds: Normal heart sounds. No murmur heard.    No friction rub. No gallop.  Pulmonary:     Effort: Pulmonary effort is normal. No respiratory distress.     Breath sounds: Wheezing present.  Abdominal:     General: Bowel sounds are normal. There is no distension.     Palpations: Abdomen is soft. There is no mass.     Tenderness: There is no abdominal tenderness. There is no guarding.  Musculoskeletal:  Cervical back: Normal range of motion.  Skin:    General: Skin is warm and dry.  Neurological:     Mental Status: She is alert and oriented to person, place, and time.  Psychiatric:        Behavior: Behavior normal.     ED Results / Procedures / Treatments   Labs (all labs ordered are listed, but only abnormal results are displayed) Labs Reviewed  RESP PANEL BY RT-PCR (RSV, FLU A&B, COVID)  [id number]    EKG EKG Interpretation  Date/Time:  Saturday June 03 2022 11:30:17 EST Ventricular Rate:  55 PR Interval:  148 QRS Duration: 90 QT Interval:  468 QTC Calculation: 447 R Axis:   79 Text Interpretation: Sinus bradycardia with marked sinus arrhythmia Otherwise normal ECG When compared with ECG of 06-Dec-2020 09:45, No significant change was found Confirmed by Blanchie Dessert [phone])  on 06/03/2022 3:07:31 PM  Radiology DG Chest 2 View  Result Date: 06/03/2022 CLINICAL DATA:  Cough, subjective fever, congestion, wheezing for 1 week EXAM: CHEST - 2 VIEW COMPARISON:  Chest radiograph 03/23/2015 FINDINGS: The heart is mildly enlarged. The upper mediastinal contours are normal. There is no definite overt pulmonary edema. There is central peribronchial thickening. There is no focal consolidation. There is no pleural effusion or pneumothorax There is no acute osseous abnormality. IMPRESSION: 1. Central peribronchial thickening may reflect bronchitis. No focal consolidation or pleural effusion. 2. Mild cardiomegaly. Electronically Signed   By: Valetta Mole M.D.   On: 06/03/2022 12:55    Procedures Procedures    Medications Ordered in ED Medications  albuterol (VENTOLIN HFA) 108 (90 Base) MCG/ACT inhaler 2 puff (has no administration in time range)  dexamethasone (DECADRON) injection 10 mg (has no administration in time range)  albuterol (VENTOLIN HFA) 108 (90 Base) MCG/ACT inhaler 2 puff (2 puffs Inhalation Given 06/03/22 1213)  ipratropium-albuterol (DUONEB) 0.5-2.5 (3) MG/3ML nebulizer solution 6 mL (6 mLs Nebulization Given 06/03/22 1615)    ED Course/ Medical Decision Making/ A&P                           Medical Decision Making 66 year old female who presents emergency department chief complaint of wheezing and cough.  She is afebrile and hemodynamically stable here but has decreased air movement and wheezing.  She does not appear to have any evidence of community-acquired pneumonia after review of chest x-ray.  I personally visualized and interpreted these results.  I have also reviewed her respiratory panel which is negative for COVID flu or RSV. Patient given a DuoNeb, inhaler with improvement in her symptoms.  She was treated here with Decadron to be discharged with Medrol Dosepak, cough medication and albuterol.  She is improved after treatment here in the emergency  department without hypoxic respiratory failure and appears appropriate for discharge at this time with bronchitis and bronchospasm.   Amount and/or Complexity of Data Reviewed Radiology: ordered and independent interpretation performed. ECG/medicine tests: ordered and independent interpretation performed.    Details: Sinus bradycardia at a rate of 55  Risk Prescription drug management.          Final Clinical Impression(s) / ED Diagnoses Final diagnoses:  Upper respiratory tract infection, unspecified type  Bronchospasm with bronchitis, acute    Rx / DC Orders ED Discharge Orders     None         Margarita Mail, PA-C 06/03/22 2256    Blanchie Dessert, MD 06/04/22 1551

## 2022-06-03 NOTE — Discharge Instructions (Addendum)
It appears you have bronchitis with bronchospasm which is a type of asthma.  Use the inhaler as needed for coughing and wheezing every 4-6 hours.  He may use 2 puffs.  Get help right away if you have high fever, difficulty breathing or worsening in your condition.
